# Patient Record
Sex: Female | Born: 1965 | Race: White | Hispanic: No | Marital: Married | Smoking: Never smoker
Health system: Southern US, Community
[De-identification: ages and names within clinical notes are randomized; demographics above are authoritative.]

## PROBLEM LIST (undated history)

## (undated) DIAGNOSIS — E0590 Thyrotoxicosis, unspecified without thyrotoxic crisis or storm: Secondary | ICD-10-CM

## (undated) DIAGNOSIS — K589 Irritable bowel syndrome without diarrhea: Secondary | ICD-10-CM

## (undated) DIAGNOSIS — K754 Autoimmune hepatitis: Secondary | ICD-10-CM

## (undated) DIAGNOSIS — B2790 Infectious mononucleosis, unspecified without complication: Secondary | ICD-10-CM

## (undated) HISTORY — DX: Infectious mononucleosis, unspecified without complication: B27.90

## (undated) HISTORY — DX: Autoimmune hepatitis: K75.4

## (undated) HISTORY — DX: Irritable bowel syndrome without diarrhea: K58.9

## (undated) HISTORY — DX: Thyrotoxicosis, unspecified without thyrotoxic crisis or storm: E05.90

---

## 2013-06-16 HISTORY — PX: COLONOSCOPY: SHX174

## 2017-06-22 ENCOUNTER — Telehealth: Payer: Self-pay | Admitting: Gastroenterology

## 2017-06-22 ENCOUNTER — Encounter: Payer: Self-pay | Admitting: Gastroenterology

## 2017-06-22 NOTE — Telephone Encounter (Signed)
Patient already scheduled to see APP on 06/30/17.

## 2017-06-22 NOTE — Telephone Encounter (Signed)
Routed to DOD. 

## 2017-06-22 NOTE — Telephone Encounter (Signed)
I do not see any ultrasound results or notes in Epic. Please schedule office visit next available appt with any physician or APP. Thanks

## 2017-06-30 ENCOUNTER — Ambulatory Visit: Payer: PRIVATE HEALTH INSURANCE | Admitting: Nurse Practitioner

## 2017-06-30 ENCOUNTER — Other Ambulatory Visit (INDEPENDENT_AMBULATORY_CARE_PROVIDER_SITE_OTHER): Payer: PRIVATE HEALTH INSURANCE

## 2017-06-30 ENCOUNTER — Encounter: Payer: Self-pay | Admitting: Nurse Practitioner

## 2017-06-30 VITALS — BP 110/66 | HR 93 | Ht 66.0 in | Wt 137.8 lb

## 2017-06-30 DIAGNOSIS — R945 Abnormal results of liver function studies: Secondary | ICD-10-CM

## 2017-06-30 DIAGNOSIS — R7989 Other specified abnormal findings of blood chemistry: Secondary | ICD-10-CM

## 2017-06-30 DIAGNOSIS — R634 Abnormal weight loss: Secondary | ICD-10-CM | POA: Diagnosis not present

## 2017-06-30 LAB — HEPATIC FUNCTION PANEL
ALBUMIN: 4.1 g/dL (ref 3.5–5.2)
ALT: 150 U/L — AB (ref 0–35)
AST: 168 U/L — ABNORMAL HIGH (ref 0–37)
Alkaline Phosphatase: 100 U/L (ref 39–117)
BILIRUBIN DIRECT: 0.3 mg/dL (ref 0.0–0.3)
TOTAL PROTEIN: 7.2 g/dL (ref 6.0–8.3)
Total Bilirubin: 1.2 mg/dL (ref 0.2–1.2)

## 2017-06-30 LAB — IGA: IgA: 167 mg/dL (ref 68–378)

## 2017-06-30 LAB — PROTIME-INR
INR: 1.1 ratio — AB (ref 0.8–1.0)
Prothrombin Time: 12.3 s (ref 9.6–13.1)

## 2017-06-30 NOTE — Patient Instructions (Signed)
If you are age 54 or older, your body mass index should be between 23-30. Your Body mass index is 22.24 kg/m. If this is out of the aforementioned range listed, please consider follow up with your Primary Care Provider.  If you are age 54 or younger, your body mass index should be between 19-25. Your Body mass index is 22.24 kg/m. If this is out of the aformentioned range listed, please consider follow up with your Primary Care Provider.   Your physician has requested that you go to the basement for lab work before leaving today.  I will call you with results.  Thank you for choosing me and [facility].   Willette ClusterPaula Guenther, NP

## 2017-06-30 NOTE — Progress Notes (Addendum)
Chief Complaint:  Abnormal liver labs  Referring Provider: Abner GreenspanBeth Hodges, MD      ASSESSMENT AND PLAN;   1. 52 yo female with significant transaminitis since being diagnosed with mono in September.  Also history of heavy alcohol use.  I do not have all the lab results but patient says initially her transaminases were in the 600s, they have apparently lingered around the 300s since then.  Ferritin, TIBC and other labs are pending at PCPs office.  No alcohol use through the month of December.   -Will repeat LFTs today though pattern of transaminitis not typical for alcohol.   -Patient says her viral hepatitis studies were negative, awaiting labs to be faxed -Once all of her labs come in from PCPs office I will see what remaining immune/hereditary markers of liver disease need to be checked.  If all labs returned normal I suspect she will need a liver biopsy -check INR -In the interim I have advised patient to totally avoid alcohol.  Do not take any vitamins, herbs, NSAID  2. Colon cancer screening. Normal colonoscopy two years ago by Dr. Charm BargesButler per patient. She plans to transfer care to us.  -get colon report for our records.   3.  Weight loss of 25 pounds since September which patient believes is all explainable by dietary changes    Agree with Ms. Dorice LamasGuenther's assessment and plan. Lab Results  Component Value Date   ALT 150 (H) 06/30/2017   AST 168 (H) 06/30/2017   ALKPHOS 100 06/30/2017   BILITOT 1.2 06/30/2017    LFT's better it seems. Await outside records EtOH could be part of this  Iva Booparl E. Gessner, MD, [facility]   ADDENDUM:  08/07/17 labs from 06/22/2017.  Iron saturation 39%.  Hepatitis A antibody IgM negative, hepatitis B surface antigen negative, hepatitis B core antibody IgM negative, hepatitis C antibody negative mitochondrial antibody negative.  Ferritin 920, hereditary hemochromatosis D\NA, no mutation identified, ammonia 53  Will make patient follow up now that I have  reviewed remaining labs.  HPI:   Patient is a 52 year old female referred by PCP for abnormal liver chemistries.  In September she had mono. Per patient at that time her AST and ALT were in the 600's. They improved in October and even more so in November per patient. Then in November she drank ETOH and transaminases went up into 300's again .  Remaining liver chemistries normal.  No ETOH in December.  She had repeat LFTs last week, I do not have those results however.  She takes an occas ibuprofen. No herbs, just mild thistle. No exposure to paints, fumes, etc.. No FMH of liver disease. No fevers. She gets a little more tired in evenings then she used to. She has lost 25 pounds since Sept but trying to eat healthy and eliminating fatty foods, sweets, etc. She has RUQ tenderness since mono. BMs are okay overall, just occas mild constipation.   She has a hx of heavy ETOH for ~ 10 year consuming 3-4 glasses of wine most nights. Per patient, Hep A,B,C negative. No illicit drug use. She had thyroid studies in Sept and apparently normal.  Patient had a normal colonoscopy two years ago at outside facility.   Evaluation: Right upper quadrant ultrasound 06/18/2017: diffusely heterogeneous echotexture of the liver with nodular contour of the left lobe of the liver these findings are suggestive of intrinsic liver disease and possible hepatic fibrosis  Past Medical History:  Diagnosis Date  .  Hyperthyroidism   . IBS (irritable bowel syndrome)      History reviewed. No pertinent surgical history. Family History  Problem Relation Age of Onset  . High blood pressure Father    Social History   Tobacco Use  . Smoking status: Never Smoker  . Smokeless tobacco: Never Used  Substance Use Topics  . Alcohol use: Yes    Comment: daily wine drinker (stopped in 02-2017)  . Drug use: No   Current Outpatient Medications  Medication Sig Dispense Refill  . levothyroxine (SYNTHROID, LEVOTHROID) 100 MCG tablet  Take 100 mcg by mouth daily before breakfast.     No current facility-administered medications for this visit.    Not on File   Review of Systems: All systems reviewed and negative except where noted in HPI.    Physical Exam:    BP 110/66   Pulse 93   Ht 5\' 6"  (1.676 m)   Wt 137 lb 12.8 oz (62.5 kg)   BMI 22.24 kg/m  Constitutional:  Thin, well nourished female in no acute distress. Psychiatric: Normal mood and affect. Behavior is normal. EENT: Pupils normal.  Conjunctivae are normal. No scleral icterus. Neck supple.  Cardiovascular: Normal rate, regular rhythm. No edema Pulmonary/chest: Effort normal and breath sounds normal. No wheezing, rales or rhonchi. Abdominal: Soft, nondistended. Nontender. Bowel sounds active throughout. There are no masses palpable. No hepatomegaly. Lymphadenopathy: No cervical adenopathy noted. Neurological: Alert and oriented to person place and time. Skin: Skin is warm and dry. No rashes noted.  Willette Cluster, NP  06/30/2017, 8:56 AM  Cc: Abner Greenspan, MD

## 2017-07-01 LAB — TISSUE TRANSGLUTAMINASE, IGA: (TTG) AB, IGA: 1 U/mL

## 2017-07-08 ENCOUNTER — Telehealth: Payer: Self-pay | Admitting: Nurse Practitioner

## 2017-07-08 NOTE — Telephone Encounter (Signed)
Patient can see her lab results through the patient portal. She says they are improving from her previous levels. She will wait to hear from her provider.

## 2017-07-28 ENCOUNTER — Ambulatory Visit: Payer: Self-pay | Admitting: Gastroenterology

## 2017-08-10 ENCOUNTER — Telehealth: Payer: Self-pay

## 2017-08-10 NOTE — Telephone Encounter (Signed)
Patient is scheduled for follow up. Reminded to abstain from alcohol.

## 2017-08-10 NOTE — Telephone Encounter (Signed)
Left a message on her cell phone requesting she call back to schedule her follow up appointment.

## 2017-08-10 NOTE — Telephone Encounter (Signed)
-----   Message from Meredith PelPaula M Guenther, NP sent at 08/07/2017 12:11 PM EST ----- Waynetta SandyBeth, please let patient know that I received all of her records from Dr. Yetta FlockHodges and I have reviewed them.  She should make a follow-up appointment with me to go over everything now that I have seen the records.  Please make sure she is not drinking alcohol.  Thanks

## 2017-08-24 ENCOUNTER — Ambulatory Visit: Payer: PRIVATE HEALTH INSURANCE | Admitting: Nurse Practitioner

## 2017-08-24 ENCOUNTER — Other Ambulatory Visit (INDEPENDENT_AMBULATORY_CARE_PROVIDER_SITE_OTHER): Payer: PRIVATE HEALTH INSURANCE

## 2017-08-24 ENCOUNTER — Encounter: Payer: Self-pay | Admitting: Nurse Practitioner

## 2017-08-24 VITALS — BP 110/60 | HR 82 | Ht 66.0 in | Wt 137.0 lb

## 2017-08-24 DIAGNOSIS — R109 Unspecified abdominal pain: Secondary | ICD-10-CM

## 2017-08-24 DIAGNOSIS — K7689 Other specified diseases of liver: Secondary | ICD-10-CM | POA: Diagnosis not present

## 2017-08-24 DIAGNOSIS — R945 Abnormal results of liver function studies: Secondary | ICD-10-CM

## 2017-08-24 DIAGNOSIS — R1013 Epigastric pain: Secondary | ICD-10-CM | POA: Diagnosis not present

## 2017-08-24 LAB — HEPATIC FUNCTION PANEL
ALBUMIN: 3.9 g/dL (ref 3.5–5.2)
ALK PHOS: 124 U/L — AB (ref 39–117)
ALT: 122 U/L — ABNORMAL HIGH (ref 0–35)
AST: 173 U/L — AB (ref 0–37)
BILIRUBIN DIRECT: 0.2 mg/dL (ref 0.0–0.3)
BILIRUBIN TOTAL: 0.9 mg/dL (ref 0.2–1.2)
Total Protein: 7.1 g/dL (ref 6.0–8.3)

## 2017-08-24 MED ORDER — OMEPRAZOLE 40 MG PO CPDR: 40.0000 mg | DELAYED_RELEASE_CAPSULE | Freq: Every day | ORAL | 2 refills | Status: DC

## 2017-08-24 NOTE — Progress Notes (Addendum)
      IMPRESSION and PLAN:    #1. 52 yo female with abnormal liver chemistries since being diagnosed with mono in September. She also has hx of ETOH use. Transaminases initially in 600's. I saw her in January AST down to 168 / ALT 150. U/S revealed diffusely heterogenous echotexture of liver with nodular left lobe. Suspect viral infection responsible for marked rise in transaminases though patient may have underlying chronic liver disease as well based on U/S findings. -obtain remaining labs today to complete workup for autoimmune / genetic causes of her abnormal liver chemistries.  -repeat LFTs today.  -hold nsaids for now -continue to abstain from ETOH, last drink was in November.  -if LFTs remain abnormal and remaining autoimmune / genetic markers negative she may need liver biopsy  #2.  Colon cancer screening. Get colonoscopy report from Dr Charm BargesButler  3. Postprandial epigastric burning. pain. She had lost a lot of weight but stable now  Burning could be NSAID related.  -hold NSAIDS for now -trial of omeprazole 40 mg 30 minutes before dinner.        Agree with Nicole Pugh's assessment and plan. Nicole Booparl E. Gessner, MD, [facility] GrahamFACG    HPI:    Chief Complaint: follow up LFTs   Patient is a 52 year old female who I saw in mid Jan for abnormal LFTs found after mono infection. Transaminases initially in 600s, they subsequently came down to 150 -160 range. Acute viral hepatitis panel was negative. U/S suggested nodular left liver lobe. Patient had a hx of ETOH overuse. I was awaiting additional labs from PCP which were received. Patient was to abstain from ETOH and follow up for further evaluation. She comes in today for follow up. Hemachromatosis study was negative, AMA was negative. She hasn't had any ETOH since November.   Nicole Pugh had lost significant amount of weight when I saw her in January. Her weight has since stabilized. She complains of mid upper abdominal burning,  worse in the evening. The  burning doesn't radiate.  Sugar seems to make the burning worse. Doesn't know if fried or spicy foods exacerbate the burning because she already avoids them. She hasn't tried anything for the burning just usually sleeps it off. She takes ibuprofen every other day and has done so for years. No GERD sx. No nausea. BMs are normal.   Review of systems:       Past Medical History:  Diagnosis Date  . Hyperthyroidism   . IBS (irritable bowel syndrome)     Patient's surgical history, family medical history, social history, medications and allergies were all reviewed in Epic    Physical Exam:     BP 110/60   Pulse 82   Ht 5\' 6"  (1.676 m)   Wt 137 lb (62.1 kg)   BMI 22.11 kg/m   GENERAL:  Thin white female in NAD PSYCH: :Pleasant, cooperative, normal affect EENT:  conjunctiva pink, mucous membranes moist, neck supple without masses CARDIAC:  RRR, no murmur heard, no peripheral edema PULM: Normal respiratory effort, lungs CTA bilaterally, no wheezing ABDOMEN:  Nondistended, soft, nontender. No obvious masses, no hepatomegaly,  normal bowel sounds SKIN:  turgor, no lesions seen Musculoskeletal:  Normal muscle tone, normal strength NEURO: Alert and oriented x 3, no focal neurologic deficits   Nicole Pugh , NP 08/24/2017, 10:55 AM

## 2017-08-24 NOTE — Patient Instructions (Addendum)
If you are age 54 or older, your body mass index should be between 23-30. Your Body mass index is 22.11 kg/m. If this is out of the aforementioned range listed, please consider follow up with your Primary Care Provider.  If you are age 54 or younger, your body mass index should be between 19-25. Your Body mass index is 22.11 kg/m. If this is out of the aformentioned range listed, please consider follow up with your Primary Care Provider.   Your physician has requested that you go to the basement for lab work before leaving today.  -Avoid ibuprofen / other anti-inflamatories for now  -tylenol as needed for pain. Don't exceed 2 grams in 24 hours  -continue to avoid alcohol.   Will call labs results.  Call with an update in three weeks.  Thank you for choosing me and [facility].   Willette ClusterPaula Guenther, NP

## 2017-08-25 ENCOUNTER — Encounter: Payer: Self-pay | Admitting: Nurse Practitioner

## 2017-08-27 LAB — CERULOPLASMIN: CERULOPLASMIN: 32 mg/dL (ref 18–53)

## 2017-08-27 LAB — ANTI-NUCLEAR AB-TITER (ANA TITER): ANA Titer 1: 1:160 {titer} — ABNORMAL HIGH

## 2017-08-27 LAB — ALPHA-1-ANTITRYPSIN: A1 ANTITRYPSIN SER: 185 mg/dL (ref 83–199)

## 2017-08-27 LAB — ANTI-SMOOTH MUSCLE ANTIBODY, IGG

## 2017-08-27 LAB — ANA: Anti Nuclear Antibody(ANA): POSITIVE — AB

## 2017-09-01 ENCOUNTER — Other Ambulatory Visit: Payer: Self-pay

## 2017-09-01 DIAGNOSIS — R932 Abnormal findings on diagnostic imaging of liver and biliary tract: Secondary | ICD-10-CM

## 2017-09-01 DIAGNOSIS — R945 Abnormal results of liver function studies: Secondary | ICD-10-CM

## 2017-09-02 ENCOUNTER — Telehealth: Payer: Self-pay | Admitting: Nurse Practitioner

## 2017-09-02 ENCOUNTER — Other Ambulatory Visit: Payer: Self-pay

## 2017-09-02 DIAGNOSIS — R932 Abnormal findings on diagnostic imaging of liver and biliary tract: Secondary | ICD-10-CM

## 2017-09-02 DIAGNOSIS — R7989 Other specified abnormal findings of blood chemistry: Secondary | ICD-10-CM

## 2017-09-02 DIAGNOSIS — R945 Abnormal results of liver function studies: Secondary | ICD-10-CM

## 2017-09-02 NOTE — Telephone Encounter (Signed)
Radiology scheduling called to state order that was put in yesterday as a needle placement needs to be changed to US liver biopsy. Image code is #[id number].

## 2017-09-02 NOTE — Telephone Encounter (Signed)
Order corrected

## 2017-09-14 HISTORY — PX: PERCUTANEOUS LIVER BIOPSY: SUR136

## 2017-09-16 ENCOUNTER — Other Ambulatory Visit: Payer: Self-pay | Admitting: Radiology

## 2017-09-18 ENCOUNTER — Ambulatory Visit (HOSPITAL_COMMUNITY)
Admission: RE | Admit: 2017-09-18 | Discharge: 2017-09-18 | Disposition: A | Discharge status: Home / Self Care | Payer: PRIVATE HEALTH INSURANCE | Source: Ambulatory Visit | Attending: Nurse Practitioner | Admitting: Nurse Practitioner

## 2017-09-18 DIAGNOSIS — R7989 Other specified abnormal findings of blood chemistry: Secondary | ICD-10-CM | POA: Diagnosis not present

## 2017-09-18 DIAGNOSIS — R932 Abnormal findings on diagnostic imaging of liver and biliary tract: Secondary | ICD-10-CM | POA: Insufficient documentation

## 2017-09-18 DIAGNOSIS — K740 Hepatic fibrosis: Secondary | ICD-10-CM | POA: Diagnosis not present

## 2017-09-18 DIAGNOSIS — R945 Abnormal results of liver function studies: Secondary | ICD-10-CM

## 2017-09-18 LAB — CBC
HCT: 37.8 % (ref 36.0–46.0)
HEMOGLOBIN: 12.3 g/dL (ref 12.0–15.0)
MCH: 32 pg (ref 26.0–34.0)
MCHC: 32.5 g/dL (ref 30.0–36.0)
MCV: 98.4 fL (ref 78.0–100.0)
Platelets: 135 10*3/uL — ABNORMAL LOW (ref 150–400)
RBC: 3.84 MIL/uL — AB (ref 3.87–5.11)
RDW: 13.8 % (ref 11.5–15.5)
WBC: 3.9 10*3/uL — ABNORMAL LOW (ref 4.0–10.5)

## 2017-09-18 LAB — PROTIME-INR
INR: 1.07
Prothrombin Time: 13.8 seconds (ref 11.4–15.2)

## 2017-09-18 MED ORDER — MIDAZOLAM HCL 2 MG/2ML IJ SOLN
INTRAMUSCULAR | Status: AC
  Filled 2017-09-18: qty 2

## 2017-09-18 MED ORDER — FENTANYL CITRATE (PF) 100 MCG/2ML IJ SOLN
INTRAMUSCULAR | Status: AC
  Filled 2017-09-18: qty 2

## 2017-09-18 MED ORDER — SODIUM CHLORIDE 0.9 % IV SOLN: INTRAVENOUS | Status: DC

## 2017-09-18 MED ORDER — HYDROMORPHONE HCL 1 MG/ML IJ SOLN
INTRAMUSCULAR | Status: AC | PRN
  Administered 2017-09-18: 1 mg via INTRAVENOUS

## 2017-09-18 MED ORDER — LIDOCAINE HCL (PF) 1 % IJ SOLN
INTRAMUSCULAR | Status: AC
  Filled 2017-09-18: qty 30

## 2017-09-18 MED ORDER — HYDROMORPHONE HCL 1 MG/ML IJ SOLN
INTRAMUSCULAR | Status: AC
  Filled 2017-09-18: qty 1

## 2017-09-18 MED ORDER — SODIUM CHLORIDE 0.9 % IV SOLN
INTRAVENOUS | Status: AC | PRN
  Administered 2017-09-18: 10 mL/h via INTRAVENOUS

## 2017-09-18 MED ORDER — FENTANYL CITRATE (PF) 100 MCG/2ML IJ SOLN
INTRAMUSCULAR | Status: AC | PRN
  Administered 2017-09-18: 50 ug via INTRAVENOUS

## 2017-09-18 MED ORDER — GELATIN ABSORBABLE 12-7 MM EX MISC
CUTANEOUS | Status: AC
  Filled 2017-09-18: qty 1

## 2017-09-18 MED ORDER — HYDROCODONE-ACETAMINOPHEN 5-325 MG PO TABS: 1.0000 | ORAL_TABLET | ORAL | Status: DC | PRN

## 2017-09-18 MED ORDER — MIDAZOLAM HCL 2 MG/2ML IJ SOLN
INTRAMUSCULAR | Status: AC | PRN
  Administered 2017-09-18 (×2): 1 mg via INTRAVENOUS

## 2017-09-18 NOTE — Sedation Documentation (Signed)
Patient is resting comfortably. 

## 2017-09-18 NOTE — Discharge Instructions (Signed)
**Note -identified via Obfuscation** Liver Biopsy, Care After  Refer to this sheet in the next few weeks. These instructions provide you with information on caring for yourself after your procedure. Your health care provider may also give you more specific instructions. Your treatment has been planned according to current medical practices, but problems sometimes occur. Call your health care provider if you have any problems or questions after your procedure.  What can I expect after the procedure?  After your procedure, it is typical to have the following:  · A small amount of discomfort in the area where the biopsy was done and in the right shoulder or shoulder blade.  · A small amount of bruising around the area where the biopsy was done and on the skin over the liver.  · Sleepiness and fatigue for the rest of the day.    Follow these instructions at home:  · Rest at home for 1-2 days or as directed by your health care provider.  · Have a friend or family member stay with you for at least 24 hours.  · Because of the medicines used during the procedure, you should not do the following things in the first 24 hours:  ? Drive.  ? Use machinery.  ? Be responsible for the care of other people.  ? Sign legal documents.  ? Take a bath or shower.  · There are many different ways to close and cover an incision, including stitches, skin glue, and adhesive strips. Follow your health care provider's instructions on:  ? Incision care.  ? Bandage (dressing) changes and removal.  ? Incision closure removal.  · Do not drink alcohol in the first week.  · Do not lift more than 5 pounds or play contact sports for 2 weeks after this test.  · Take medicines only as directed by your health care provider. Do not take medicine containing aspirin or non-steroidal anti-inflammatory medicines such as ibuprofen for 1 week after this test.  · It is your responsibility to get your test results.  Contact a health care provider if:  · You have increased bleeding from an incision  that results in more than a small spot of blood.  · You have redness, swelling, or increasing pain in any incisions.  · You notice a discharge or a bad smell coming from any of your incisions.  · You have a fever or chills.  Get help right away if:  · You develop swelling, bloating, or pain in your abdomen.  · You become dizzy or faint.  · You develop a rash.  · You are nauseous or vomit.  · You have difficulty breathing, feel short of breath, or feel faint.  · You develop chest pain.  · You have problems with your speech or vision.  · You have trouble balancing or moving your arms or legs.  This information is not intended to replace advice given to you by your health care provider. Make sure you discuss any questions you have with your health care provider.  Document Released: 12/20/2004 Document Revised: 11/08/2015 Document Reviewed: 07/29/2013  Elsevier Interactive Patient Education © 2018 [employer] Inc[employer]

## 2017-09-18 NOTE — H&P (Signed)
Chief Complaint: Patient was seen in consultation today for liver core biopsy at the request of Meredith PelGuenther,Paula M  Referring Physician(s): Meredith PelGuenther,Paula M Dr Sharia Reeve Gessner  Supervising Physician: Malachy MoanMcCullough, Heath  [facility]  History of Present Illness: Nicole Pugh is a [age] y.o. female   Pt diagnosed with Mononucleosis 6 mo ago Liver functions have yet to return to normal  Lafe GarinP Guenther NP note 08/24/17: 52 yo female with abnormal liver chemistries since being diagnosed with mono in September. She also has hx of ETOH use. Transaminases initially in 600's. I saw her in January AST down to 168 / ALT 150. U/S revealed diffusely heterogenous echotexture of liver with nodular left lobe. Suspect viral infection responsible for marked rise in transaminases though patient may have underlying chronic liver disease as well based on U/S findings.  Request for liver core biopsy   Past Medical History:  Diagnosis Date  . Hyperthyroidism   . IBS (irritable bowel syndrome)     No past surgical history on file.  Allergies: Patient has no known allergies.  Medications: Prior to Admission medications   Medication Sig Start Date End Date Taking? Authorizing Provider  levothyroxine (SYNTHROID, LEVOTHROID) 100 MCG tablet Take 100 mcg by mouth daily before breakfast.   Yes [provider]  loratadine (CLARITIN) 10 MG tablet Take 10 mg by mouth daily.   Yes [provider]  omeprazole (PRILOSEC) 40 MG capsule Take 1 capsule (40 mg total) by mouth daily. Take 30 minutes before dinner 08/24/17  Yes Meredith PelGuenther, Paula M, NP     Family History  Problem Relation Age of Onset  . High blood pressure Father     Social History   Socioeconomic History  . Marital status: Married    Spouse name: Not on file  . Number of children: Not on file  . Years of education: Not on file  . Highest education level: Not on file  Occupational History  . Not on file  Social  Needs  . Financial resource strain: Not on file  . Food insecurity:    Worry: Not on file    Inability: Not on file  . Transportation needs:    Medical: Not on file    Non-medical: Not on file  Tobacco Use  . Smoking status: Never Smoker  . Smokeless tobacco: Never Used  Substance and Sexual Activity  . Alcohol use: Yes    Comment: daily wine drinker (stopped in 02-2017)  . Drug use: No  . Sexual activity: Yes  Lifestyle  . Physical activity:    Days per week: Not on file    Minutes per session: Not on file  . Stress: Not on file  Relationships  . Social connections:    Talks on phone: Not on file    Gets together: Not on file    Attends religious service: Not on file    Active member of club or organization: Not on file    Attends meetings of clubs or organizations: Not on file    Relationship status: Not on file  Other Topics Concern  . Not on file  Social History Narrative  . Not on file    Review of Systems: A 12 point ROS discussed and pertinent positives are indicated in the HPI above.  All other systems are negative.  Review of Systems  Constitutional: Negative for activity change and fatigue.  Cardiovascular: Negative for chest pain.  Gastrointestinal: Negative for abdominal pain.  Psychiatric/Behavioral: Negative  for behavioral problems and confusion.    Vital Signs: BP 118/70 (BP Location: Right Arm)   Pulse 70   Temp 97.8 F (36.6 C) (Oral)   Ht 5\' 6"  (1.676 m)   Wt 136 lb (61.7 kg)   SpO2 100%   BMI 21.95 kg/m   Physical Exam  Constitutional: She is oriented to person, place, and time.  Cardiovascular: Normal rate and regular rhythm.  Pulmonary/Chest: Effort normal and breath sounds normal.  Abdominal: Soft. Bowel sounds are normal.  Musculoskeletal: Normal range of motion.  Neurological: She is alert and oriented to person, place, and time.  Skin: Skin is warm and dry.  Psychiatric: She has a normal mood and affect. Her behavior is normal.  Judgment and thought content normal.  Nursing note and vitals reviewed.   Imaging: No results found.  Labs:  CBC: Recent Labs    09/18/17 1105  WBC 3.9*  HGB 12.3  HCT 37.8  PLT 135*    COAGS: Recent Labs    06/30/17 0953 09/18/17 1105  INR 1.1* 1.07    BMP: No results for input(s): NA, K, CL, CO2, GLUCOSE, BUN, CALCIUM, CREATININE, GFRNONAA, GFRAA in the last 8760 hours.  Invalid input(s): CMP  LIVER FUNCTION TESTS: Recent Labs    06/30/17 0953 08/24/17 1141  BILITOT 1.2 0.9  AST 168* 173*  ALT 150* 122*  ALKPHOS 100 124*  PROT 7.2 7.1  ALBUMIN 4.1 3.9    TUMOR MARKERS: No results for input(s): AFPTM, CEA, CA199, CHROMGRNA in the last 8760 hours.  Assessment and Plan:  Elevated liver function tests Scheduled for liver core biopsy Risks and benefits discussed with the patient including, but not limited to bleeding, infection, damage to adjacent structures or low yield requiring additional tests.  All of the patient's questions were answered, patient is agreeable to proceed. Consent signed and in chart.   Thank you for this interesting consult.  I greatly enjoyed meeting [name] and look forward to participating in their care.  A copy of this report was sent to the requesting provider on this date.  Electronically Signed: Robet Leu, PA-C 09/18/2017, 12:11 PM   I spent a total of  30 Minutes   in face to face in clinical consultation, greater than 50% of which was counseling/coordinating care for liver core biopsy

## 2017-09-18 NOTE — Procedures (Signed)
Interventional Radiology Procedure Note  Procedure: Random liver bx  Complications: None  Estimated Blood Loss: None  Recommendations: - DC home after bedrest  Signed,  Sterling BigHeath K. Yohannes Waibel, MD

## 2017-09-29 ENCOUNTER — Telehealth: Payer: Self-pay | Admitting: Nurse Practitioner

## 2017-09-29 ENCOUNTER — Encounter: Payer: Self-pay | Admitting: Internal Medicine

## 2017-09-29 ENCOUNTER — Other Ambulatory Visit (INDEPENDENT_AMBULATORY_CARE_PROVIDER_SITE_OTHER): Payer: PRIVATE HEALTH INSURANCE

## 2017-09-29 ENCOUNTER — Other Ambulatory Visit: Payer: Self-pay

## 2017-09-29 DIAGNOSIS — K754 Autoimmune hepatitis: Secondary | ICD-10-CM | POA: Diagnosis not present

## 2017-09-29 LAB — HEPATIC FUNCTION PANEL
ALK PHOS: 154 U/L — AB (ref 39–117)
ALT: 71 U/L — ABNORMAL HIGH (ref 0–35)
AST: 110 U/L — ABNORMAL HIGH (ref 0–37)
Albumin: 3.8 g/dL (ref 3.5–5.2)
BILIRUBIN DIRECT: 0.1 mg/dL (ref 0.0–0.3)
BILIRUBIN TOTAL: 0.5 mg/dL (ref 0.2–1.2)
TOTAL PROTEIN: 6.9 g/dL (ref 6.0–8.3)

## 2017-09-29 MED ORDER — PREDNISONE 10 MG PO TABS: ORAL_TABLET | ORAL | 1 refills | Status: DC

## 2017-09-29 NOTE — Telephone Encounter (Signed)
I called her and explained that she has autoimmune hepatitis.  She needs labs today, tomorrow or Thursday - she knows to come - please order  LFT IgG thiopurine methyltransferase Hepatitis A Antibody total Hepatitis B surface antibody Hepatitis B core antibody total  Start prednisone rx - side effects reviewed  10 mg tabs - take 4 each day # 120 1 refill  Will contact her with results  Need to give her an early May appointment when I am back in the office (add on) so I can meet her and explain more and f/u

## 2017-09-29 NOTE — Telephone Encounter (Signed)
Please review the pathology and advise. She is scheduled to see Dr Leone PayorGessner 11/02/17

## 2017-09-29 NOTE — Progress Notes (Signed)
I called her and am starting Rx

## 2017-09-29 NOTE — Telephone Encounter (Signed)
I spoke with the patient.  Labs are ordered in Epic and she will come this afternoon to have done.  Prednisone was called to her pharmacy and I scheduled her to see you on 10/20/17 at 9:15 am.

## 2017-09-29 NOTE — Telephone Encounter (Signed)
Patient calling to get results from US liver biopsy done on 4.5.19.

## 2017-09-29 NOTE — Telephone Encounter (Signed)
Looks like autoimmune hepatitis. I forwarded results to Leone PayorGessner, he may want to start steroids / immunomodulator since appt with him is several weeks away. Thanks

## 2017-10-04 LAB — HEPATITIS A ANTIBODY, TOTAL: Hepatitis A AB,Total: NONREACTIVE

## 2017-10-04 LAB — IGG: IGG (IMMUNOGLOBIN G), SERUM: 1462 mg/dL (ref 694–1618)

## 2017-10-04 LAB — HEPATITIS B SURFACE ANTIBODY,QUALITATIVE: Hep B S Ab: NONREACTIVE

## 2017-10-04 LAB — HEPATITIS B CORE ANTIBODY, TOTAL: Hep B Core Total Ab: NONREACTIVE

## 2017-10-04 LAB — THIOPURINE METHYLTRANSFERASE (TPMT), RBC: THIOPURINE METHYLTRANSFERASE, RBC: 15 nmol/h/mL

## 2017-10-04 NOTE — Progress Notes (Signed)
Let her know that LFT's about the same  Other important info is that she is NOT immune to Hep A and B so will need vaccinations  She has an OV 5/7 and we could do then or she can come in before  Have her get LFt's 5/3 or 5/6 please  Dx autoimmune hepatitis

## 2017-10-05 ENCOUNTER — Telehealth: Payer: Self-pay | Admitting: Nurse Practitioner

## 2017-10-05 NOTE — Telephone Encounter (Signed)
Pt returned your call and would like a call back .. °

## 2017-10-05 NOTE — Telephone Encounter (Signed)
I gave her the test results from 44/16/19.  She has an upcoming appointment on 10/20/17.

## 2017-10-07 ENCOUNTER — Other Ambulatory Visit: Payer: Self-pay

## 2017-10-07 DIAGNOSIS — K754 Autoimmune hepatitis: Secondary | ICD-10-CM

## 2017-10-16 ENCOUNTER — Other Ambulatory Visit (INDEPENDENT_AMBULATORY_CARE_PROVIDER_SITE_OTHER): Payer: PRIVATE HEALTH INSURANCE

## 2017-10-16 DIAGNOSIS — K754 Autoimmune hepatitis: Secondary | ICD-10-CM

## 2017-10-16 LAB — HEPATIC FUNCTION PANEL
ALBUMIN: 3.7 g/dL (ref 3.5–5.2)
ALT: 50 U/L — ABNORMAL HIGH (ref 0–35)
AST: 38 U/L — AB (ref 0–37)
Alkaline Phosphatase: 108 U/L (ref 39–117)
Bilirubin, Direct: 0.2 mg/dL (ref 0.0–0.3)
TOTAL PROTEIN: 6.7 g/dL (ref 6.0–8.3)
Total Bilirubin: 0.7 mg/dL (ref 0.2–1.2)

## 2017-10-16 NOTE — Progress Notes (Signed)
Labs improved  Will review more at office visit

## 2017-10-20 ENCOUNTER — Ambulatory Visit: Payer: PRIVATE HEALTH INSURANCE | Admitting: Internal Medicine

## 2017-10-20 ENCOUNTER — Encounter: Payer: Self-pay | Admitting: Internal Medicine

## 2017-10-20 VITALS — BP 130/72 | HR 76 | Ht 66.0 in | Wt 135.1 lb

## 2017-10-20 DIAGNOSIS — R1013 Epigastric pain: Secondary | ICD-10-CM | POA: Diagnosis not present

## 2017-10-20 DIAGNOSIS — K754 Autoimmune hepatitis: Secondary | ICD-10-CM | POA: Diagnosis not present

## 2017-10-20 DIAGNOSIS — Z23 Encounter for immunization: Secondary | ICD-10-CM

## 2017-10-20 DIAGNOSIS — Z79899 Other long term (current) drug therapy: Secondary | ICD-10-CM

## 2017-10-20 DIAGNOSIS — Z7960 Long term (current) use of unspecified immunomodulators and immunosuppressants: Secondary | ICD-10-CM

## 2017-10-20 MED ORDER — ZOSTER VAC RECOMB ADJUVANTED 50 MCG/0.5ML IM SUSR: 0.5000 mL | Freq: Once | INTRAMUSCULAR | 1 refills | Status: AC

## 2017-10-20 MED ORDER — HYOSCYAMINE SULFATE 0.125 MG SL SUBL: 0.1250 mg | SUBLINGUAL_TABLET | SUBLINGUAL | 0 refills | Status: DC | PRN

## 2017-10-20 NOTE — Progress Notes (Signed)
YARELIN REICHARDT 52 y.o. 1966-04-13 [id number]  Assessment & Plan:   Encounter Diagnoses  Name Primary?  . Autoimmune hepatitis treated with steroids ([place]) Yes  . Long-term use of immunosuppressant medication   . Abdominal pain, epigastric, postprandial and usually with fatty food    Reduce prednisone to 30 mg daily  Anticipate starting azathioprine at 75 mg daily but clarify her question about whether or not she needs a measles booster since that is a live vaccine would not want to do that now.  I will check with her primary care provider perhaps a titer is all we need to do at this point.  Alcohol does not seem to be a culprit but she should avoid that for now.  The biopsy did not suggest steatohepatitis in any way though she was a regular with 1 glass of wine a day drinker so that may have contributed to the inflammation but I do not think it was the primary problem as best we can tell  Return to see me in July sooner if needed  Lab follow-up to be scheduled pending starting the azathioprine  Initiate hepatitis A and B vaccination today  I have given her a prescription to try to get the Shingrix shingles vaccine when she can as azathioprine may increase the risk of contracting zoster  I did educate her about avoiding live vaccines.  Trial of hyoscyamine for the abdominal pain and discontinue PPI as it did not help  I appreciate the opportunity to care for this patient. CC: Marco Collie, MD  Spoke to Dr. Nyra Capes - no need to test for or booster for measles  Will initiate Azathioprine 75 mg qd  LFT's CBC in 2 weeks Subjective:   Chief Complaint: Autoimmune hepatitis follow-up in clinic initial visit with me  HPI The patient is a 52 year old woman with husband here also today, recently diagnosed with autoimmune hepatitis by liver biopsy, based upon a 1-160+ ANA titer with nucleolar pattern.  She had been diagnosed with mononucleosis last fall and had elevated  transaminases as high as 600 apparently and they never normalized.  She was referred to [place] for further evaluation at which point she had a battery of serologies and the ANA was positive.  She had transaminases above 300 in December 2018, which were in the 8-10 times abnormal range.  Ultrasound demonstrated heterogeneous echogenicity of the liver and also nodular changes in the left lobe in January.  September 18, 2017 liver biopsy findings as below.  No suggestion of steatosis or steatohepatitis.  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 levothyroxine (SYNTHROID, LEVOTHROID) 100 MCG tablet Take 100 mcg by mouth daily before breakfast.  . loratadine (CLARITIN) 10 MG tablet Take 10 mg by mouth daily.  . predniSONE (DELTASONE) 10 MG tablet Take 3 tablets daily   . [DISCONTINUED] omeprazole (PRILOSEC) 40 MG capsule Take 1 capsule (40 mg total) by mouth daily. Take 30 minutes before dinner  . [DISCONTINUED] predniSONE (DELTASONE) 10 MG tablet Take 4 tablets by mouth daily. (Patient taking differently: Take 3  tablets by mouth daily.)   Past Medical History:  Diagnosis Date  . Autoimmune hepatitis ([place])   . Hyperthyroidism   . IBS (irritable bowel syndrome)   . Mononucleosis    Past Surgical History:  Procedure Laterality Date  . COLONOSCOPY  2015   10 yr f/u per patient  . 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   Review of Systems As per HPI  Objective:   Physical Exam '@BP'  130/72   Pulse 76   Ht '5\' 6"'  (1.676 m)   Wt 135 lb 2 oz (61.3 kg)   BMI 21.81 kg/m  @  General:  NAD Eyes:   anicteric Lungs:  clear Heart::  S1S2 no rubs, murmurs or gallops Abdomen:  soft and nontender, BS+ no hepatosplenomegaly or mass Ext:   no edema, cyanosis or clubbing Skin   without stigmata of chronic liver disease   Data Reviewed:  See HPI

## 2017-10-20 NOTE — Patient Instructions (Addendum)
Decrease your prednisone to  daily.  Stop your omeprazole.  Watch your fatty food intake.  We are giving you the rx for Shingrix to take to your pharmacy. Currently it is on national back order.  Dr Leone Payor is going to check on the measles vaccine and get back in touch with you.  We have sent the following medications to your pharmacy for you to pick up at your convenience: hyoscyamine  We are going to try and obtain the colonoscopy done with Dr Charm Barges.  We would like to see you July 10th at at 8:45 AM  Today you have been given your first Twinrix vaccine.  You next injection is 11/19/17 at 9:00AM   I appreciate the opportunity to care for you. Stan Head, MD, [facility]

## 2017-10-23 ENCOUNTER — Telehealth: Payer: Self-pay

## 2017-10-23 DIAGNOSIS — K754 Autoimmune hepatitis: Secondary | ICD-10-CM

## 2017-10-23 MED ORDER — AZATHIOPRINE 75 MG PO TABS: 1.0000 | ORAL_TABLET | Freq: Every day | ORAL | 0 refills | Status: DC

## 2017-10-23 NOTE — Telephone Encounter (Signed)
Patient informed and verbalized understanding. Rx sent in and lab orders entered.

## 2017-10-23 NOTE — Telephone Encounter (Signed)
-----  Message from Gatha Mayer, MD sent at 10/23/2017  7:43 AM EDT ----- Regarding: azathioprine, MMR Please tell patient  1) No need for measles booster or testing - I talked to Dr. Nyra Capes  2) Start azathioprine 75 mg qd # 90 no refill  3) LFT's and CBC in 2 weeks  4) If she feels different - pain, nausea or vomiting after starting azathioprine call back

## 2017-11-02 ENCOUNTER — Ambulatory Visit: Payer: PRIVATE HEALTH INSURANCE | Admitting: Internal Medicine

## 2017-11-11 ENCOUNTER — Other Ambulatory Visit (INDEPENDENT_AMBULATORY_CARE_PROVIDER_SITE_OTHER): Payer: PRIVATE HEALTH INSURANCE

## 2017-11-11 DIAGNOSIS — K754 Autoimmune hepatitis: Secondary | ICD-10-CM | POA: Diagnosis not present

## 2017-11-11 LAB — CBC WITH DIFFERENTIAL/PLATELET
Basophils Absolute: 0 10*3/uL (ref 0.0–0.1)
Basophils Relative: 0.3 % (ref 0.0–3.0)
EOS ABS: 0.1 10*3/uL (ref 0.0–0.7)
Eosinophils Relative: 1.1 % (ref 0.0–5.0)
HEMATOCRIT: 42.2 % (ref 36.0–46.0)
HEMOGLOBIN: 14.2 g/dL (ref 12.0–15.0)
LYMPHS PCT: 18.9 % (ref 12.0–46.0)
Lymphs Abs: 1.6 10*3/uL (ref 0.7–4.0)
MCHC: 33.7 g/dL (ref 30.0–36.0)
MCV: 99.7 fl (ref 78.0–100.0)
MONO ABS: 0.6 10*3/uL (ref 0.1–1.0)
Monocytes Relative: 6.9 % (ref 3.0–12.0)
Neutro Abs: 6.3 10*3/uL (ref 1.4–7.7)
Neutrophils Relative %: 72.8 % (ref 43.0–77.0)
Platelets: 163 10*3/uL (ref 150.0–400.0)
RBC: 4.24 Mil/uL (ref 3.87–5.11)
RDW: 13.9 % (ref 11.5–15.5)
WBC: 8.7 10*3/uL (ref 4.0–10.5)

## 2017-11-11 LAB — HEPATIC FUNCTION PANEL
ALBUMIN: 3.9 g/dL (ref 3.5–5.2)
ALT: 60 U/L — AB (ref 0–35)
AST: 47 U/L — AB (ref 0–37)
Alkaline Phosphatase: 78 U/L (ref 39–117)
BILIRUBIN TOTAL: 0.5 mg/dL (ref 0.2–1.2)
Bilirubin, Direct: 0.2 mg/dL (ref 0.0–0.3)
TOTAL PROTEIN: 6.9 g/dL (ref 6.0–8.3)

## 2017-11-11 NOTE — Progress Notes (Signed)
Labs are stable overall - AST and ALt slightly elevated but in overall scheme this is ok  Repeat same labs in 2 weeks No change in meds except Ca and vit D

## 2017-11-19 ENCOUNTER — Ambulatory Visit (INDEPENDENT_AMBULATORY_CARE_PROVIDER_SITE_OTHER): Payer: PRIVATE HEALTH INSURANCE | Admitting: Internal Medicine

## 2017-11-19 DIAGNOSIS — Z23 Encounter for immunization: Secondary | ICD-10-CM | POA: Diagnosis not present

## 2017-11-19 DIAGNOSIS — K754 Autoimmune hepatitis: Secondary | ICD-10-CM

## 2017-11-25 ENCOUNTER — Other Ambulatory Visit (INDEPENDENT_AMBULATORY_CARE_PROVIDER_SITE_OTHER): Payer: PRIVATE HEALTH INSURANCE

## 2017-11-25 DIAGNOSIS — K754 Autoimmune hepatitis: Secondary | ICD-10-CM | POA: Diagnosis not present

## 2017-11-25 LAB — CBC WITH DIFFERENTIAL/PLATELET
BASOS ABS: 0 10*3/uL (ref 0.0–0.1)
Basophils Relative: 0.2 % (ref 0.0–3.0)
EOS ABS: 0.1 10*3/uL (ref 0.0–0.7)
Eosinophils Relative: 0.8 % (ref 0.0–5.0)
HEMATOCRIT: 42.1 % (ref 36.0–46.0)
HEMOGLOBIN: 14.3 g/dL (ref 12.0–15.0)
LYMPHS PCT: 20.1 % (ref 12.0–46.0)
Lymphs Abs: 1.9 10*3/uL (ref 0.7–4.0)
MCHC: 34.1 g/dL (ref 30.0–36.0)
MCV: 98.4 fl (ref 78.0–100.0)
Monocytes Absolute: 0.7 10*3/uL (ref 0.1–1.0)
Monocytes Relative: 7.3 % (ref 3.0–12.0)
Neutro Abs: 6.7 10*3/uL (ref 1.4–7.7)
Neutrophils Relative %: 71.6 % (ref 43.0–77.0)
Platelets: 183 10*3/uL (ref 150.0–400.0)
RBC: 4.28 Mil/uL (ref 3.87–5.11)
RDW: 14.5 % (ref 11.5–15.5)
WBC: 9.4 10*3/uL (ref 4.0–10.5)

## 2017-11-25 LAB — HEPATIC FUNCTION PANEL
ALT: 62 U/L — AB (ref 0–35)
AST: 50 U/L — ABNORMAL HIGH (ref 0–37)
Albumin: 3.9 g/dL (ref 3.5–5.2)
Alkaline Phosphatase: 80 U/L (ref 39–117)
Bilirubin, Direct: 0.1 mg/dL (ref 0.0–0.3)
TOTAL PROTEIN: 6.5 g/dL (ref 6.0–8.3)
Total Bilirubin: 0.6 mg/dL (ref 0.2–1.2)

## 2017-11-26 ENCOUNTER — Encounter: Payer: Self-pay | Admitting: Internal Medicine

## 2017-11-29 ENCOUNTER — Other Ambulatory Visit: Payer: Self-pay | Admitting: Internal Medicine

## 2017-11-29 DIAGNOSIS — K754 Autoimmune hepatitis: Secondary | ICD-10-CM

## 2017-11-29 NOTE — Progress Notes (Signed)
Labs stable Continue same treatment Repeat labs again before 7/10 visit My Chart message

## 2017-12-21 ENCOUNTER — Other Ambulatory Visit (INDEPENDENT_AMBULATORY_CARE_PROVIDER_SITE_OTHER): Payer: PRIVATE HEALTH INSURANCE

## 2017-12-21 DIAGNOSIS — K754 Autoimmune hepatitis: Secondary | ICD-10-CM | POA: Diagnosis not present

## 2017-12-21 LAB — HEPATIC FUNCTION PANEL
ALK PHOS: 69 U/L (ref 39–117)
ALT: 47 U/L — AB (ref 0–35)
AST: 44 U/L — AB (ref 0–37)
Albumin: 3.7 g/dL (ref 3.5–5.2)
Bilirubin, Direct: 0.2 mg/dL (ref 0.0–0.3)
TOTAL PROTEIN: 6.3 g/dL (ref 6.0–8.3)
Total Bilirubin: 0.8 mg/dL (ref 0.2–1.2)

## 2017-12-21 LAB — CBC WITH DIFFERENTIAL/PLATELET
BASOS ABS: 0 10*3/uL (ref 0.0–0.1)
BASOS PCT: 0.1 % (ref 0.0–3.0)
Eosinophils Absolute: 0 10*3/uL (ref 0.0–0.7)
Eosinophils Relative: 0 % (ref 0.0–5.0)
HEMATOCRIT: 39.1 % (ref 36.0–46.0)
HEMOGLOBIN: 13.5 g/dL (ref 12.0–15.0)
LYMPHS PCT: 6.9 % — AB (ref 12.0–46.0)
Lymphs Abs: 0.5 10*3/uL — ABNORMAL LOW (ref 0.7–4.0)
MCHC: 34.5 g/dL (ref 30.0–36.0)
MCV: 98.5 fl (ref 78.0–100.0)
MONOS PCT: 3 % (ref 3.0–12.0)
Monocytes Absolute: 0.2 10*3/uL (ref 0.1–1.0)
Neutro Abs: 6.8 10*3/uL (ref 1.4–7.7)
Neutrophils Relative %: 90 % — ABNORMAL HIGH (ref 43.0–77.0)
Platelets: 188 10*3/uL (ref 150.0–400.0)
RBC: 3.96 Mil/uL (ref 3.87–5.11)
RDW: 16 % — AB (ref 11.5–15.5)
WBC: 7.5 10*3/uL (ref 4.0–10.5)

## 2017-12-22 NOTE — Progress Notes (Signed)
Labs a little better Will review at 7/10 visit

## 2017-12-23 ENCOUNTER — Encounter: Payer: Self-pay | Admitting: Internal Medicine

## 2017-12-23 ENCOUNTER — Ambulatory Visit: Payer: PRIVATE HEALTH INSURANCE | Admitting: Internal Medicine

## 2017-12-23 VITALS — BP 110/60 | HR 72 | Ht 66.0 in | Wt 138.0 lb

## 2017-12-23 DIAGNOSIS — Z23 Encounter for immunization: Secondary | ICD-10-CM | POA: Diagnosis not present

## 2017-12-23 DIAGNOSIS — K754 Autoimmune hepatitis: Secondary | ICD-10-CM | POA: Diagnosis not present

## 2017-12-23 DIAGNOSIS — Z7960 Long term (current) use of unspecified immunomodulators and immunosuppressants: Secondary | ICD-10-CM

## 2017-12-23 DIAGNOSIS — Z7952 Long term (current) use of systemic steroids: Secondary | ICD-10-CM

## 2017-12-23 DIAGNOSIS — Z79899 Other long term (current) drug therapy: Secondary | ICD-10-CM | POA: Diagnosis not present

## 2017-12-23 NOTE — Progress Notes (Signed)
   Nicole Pugh 52 y.o. Nov 04, 1965 [id number]  Assessment & Plan:   Long-term use of immunosuppressant medication - azathioprine Prevnar Had shingles 1 Recommend see a dermatologist annually GYN - PAP she has been seeing one annualy Sun protection  Long term (current) use of systemic steroids Ca and vit D  Autoimmune hepatitis ([place]) Improving on prednisone and AZA RTC Sept LFT and CBC 1 mo  I appreciate the opportunity to care for you. PJ:SUNHRV, Beth, MD   Subjective:   Chief Complaint: f/u autoimmune hepatitis  HPI Here for f/u on azathioprine and prednisone and feels ok LFTS have gotten better with Tx Remains off EtOH and was never heavy  Hepatic Function Latest Ref Rng & Units 12/21/2017 11/25/2017 11/11/2017  Total Protein 6.0 - 8.3 g/dL 6.3 6.5 6.9  Albumin 3.5 - 5.2 g/dL 3.7 3.9 3.9  AST 0 - 37 U/L 44(H) 50(H) 47(H)  ALT 0 - 35 U/L 47(H) 62(H) 60(H)  Alk Phosphatase 39 - 117 U/L 69 80 78  Total Bilirubin 0.2 - 1.2 mg/dL 0.8 0.6 0.5  Bilirubin, Direct 0.0 - 0.3 mg/dL 0.2 0.1 0.2    Wt Readings from Last 3 Encounters:  12/23/17 138 lb (62.6 kg)  10/20/17 135 lb 2 oz (61.3 kg)  09/18/17 136 lb (61.7 kg)    No Known Allergies Current Meds  Medication Sig  . Azathioprine 75 MG TABS Take 1 tablet (75 mg total) by mouth daily.  . hyoscyamine (LEVSIN SL) 0.125 MG SL tablet Place 1 tablet (0.125 mg total) under the tongue every 4 (four) hours as needed for cramping (abdominal pain).  Marland Kitchen levothyroxine (SYNTHROID, LEVOTHROID) 100 MCG tablet Take 100 mcg by mouth daily before breakfast.  . loratadine (CLARITIN) 10 MG tablet Take 10 mg by mouth daily.  . predniSONE (DELTASONE) 10 MG tablet Take 3 tablets daily    Past Medical History:  Diagnosis Date  . Autoimmune hepatitis ([place])   . Hyperthyroidism   . IBS (irritable bowel syndrome)   . Mononucleosis    Past Surgical History:  Procedure Laterality Date  . COLONOSCOPY  2015   10 yr f/u per patient  .  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   Review of Systems As per HPI  Objective:   Physical Exam '@BP'$  110/60   Pulse 72   Ht '5\' 6"'$  (1.676 m)   Wt 138 lb (62.6 kg)   BMI 22.27 kg/m @  General:  NAD Eyes:   anicteric Lungs:  clear Heart::  S1S2 no rubs, murmurs or gallops Abdomen:  soft and nontender, BS+ Ext:   no edema, cyanosis or clubbing    Data Reviewed:   See HPI

## 2017-12-23 NOTE — Patient Instructions (Addendum)
Today you have been given a Prevnar 13 and the information sheet that goes along with it.   Continue your sun protection through out the year.  Continue your Vitamin D and calcium daily.   Make sure and keep current with your Gynecology and Dermatology visits.    Please come in a month and get blood work drawn. No appointment needed. The lab is open from 7:30AM - 5:30PM  Mon-Friday.   Please follow up with Dr Leone PayorGessner in September.   I appreciate the opportunity to care for you. Stan Headarl Gessner, MD, [facility]

## 2017-12-23 NOTE — Assessment & Plan Note (Signed)
Ca and vit D

## 2017-12-23 NOTE — Assessment & Plan Note (Addendum)
Improving on prednisone and AZA RTC Sept LFT and CBC 1 mo

## 2017-12-23 NOTE — Assessment & Plan Note (Addendum)
Prevnar Had shingles 1 Recommend see a dermatologist annually GYN - PAP she has been seeing one annualy Sun protection

## 2018-01-19 ENCOUNTER — Other Ambulatory Visit: Payer: Self-pay | Admitting: Internal Medicine

## 2018-01-22 ENCOUNTER — Other Ambulatory Visit (INDEPENDENT_AMBULATORY_CARE_PROVIDER_SITE_OTHER): Payer: PRIVATE HEALTH INSURANCE

## 2018-01-22 DIAGNOSIS — Z7960 Long term (current) use of unspecified immunomodulators and immunosuppressants: Secondary | ICD-10-CM

## 2018-01-22 DIAGNOSIS — K754 Autoimmune hepatitis: Secondary | ICD-10-CM | POA: Diagnosis not present

## 2018-01-22 DIAGNOSIS — Z7952 Long term (current) use of systemic steroids: Secondary | ICD-10-CM

## 2018-01-22 DIAGNOSIS — Z79899 Other long term (current) drug therapy: Secondary | ICD-10-CM | POA: Diagnosis not present

## 2018-01-22 LAB — CBC WITH DIFFERENTIAL/PLATELET
BASOS ABS: 0 10*3/uL (ref 0.0–0.1)
Basophils Relative: 0.1 % (ref 0.0–3.0)
EOS ABS: 0 10*3/uL (ref 0.0–0.7)
Eosinophils Relative: 0.2 % (ref 0.0–5.0)
HCT: 38.7 % (ref 36.0–46.0)
HEMOGLOBIN: 13.2 g/dL (ref 12.0–15.0)
LYMPHS ABS: 0.5 10*3/uL — AB (ref 0.7–4.0)
Lymphocytes Relative: 6.5 % — ABNORMAL LOW (ref 12.0–46.0)
MCHC: 34.2 g/dL (ref 30.0–36.0)
MCV: 101.9 fl — ABNORMAL HIGH (ref 78.0–100.0)
MONO ABS: 0.2 10*3/uL (ref 0.1–1.0)
Monocytes Relative: 2.2 % — ABNORMAL LOW (ref 3.0–12.0)
NEUTROS PCT: 91 % — AB (ref 43.0–77.0)
Neutro Abs: 7.5 10*3/uL (ref 1.4–7.7)
Platelets: 167 10*3/uL (ref 150.0–400.0)
RBC: 3.8 Mil/uL — AB (ref 3.87–5.11)
RDW: 17.1 % — ABNORMAL HIGH (ref 11.5–15.5)
WBC: 8.2 10*3/uL (ref 4.0–10.5)

## 2018-01-22 LAB — HEPATIC FUNCTION PANEL
ALT: 42 U/L — AB (ref 0–35)
AST: 44 U/L — AB (ref 0–37)
Albumin: 3.7 g/dL (ref 3.5–5.2)
Alkaline Phosphatase: 84 U/L (ref 39–117)
BILIRUBIN DIRECT: 0.2 mg/dL (ref 0.0–0.3)
BILIRUBIN TOTAL: 1.1 mg/dL (ref 0.2–1.2)
Total Protein: 6.5 g/dL (ref 6.0–8.3)

## 2018-01-26 ENCOUNTER — Other Ambulatory Visit: Payer: Self-pay | Admitting: Internal Medicine

## 2018-01-26 DIAGNOSIS — Z79899 Other long term (current) drug therapy: Secondary | ICD-10-CM

## 2018-01-26 DIAGNOSIS — Z7960 Long term (current) use of unspecified immunomodulators and immunosuppressants: Secondary | ICD-10-CM

## 2018-01-26 NOTE — Progress Notes (Signed)
Persistent elevation transaminases Increased MCV   Medication effect from azathioprine ??  Will check thiopurine metabolites  My Chart message

## 2018-01-27 ENCOUNTER — Other Ambulatory Visit: Payer: PRIVATE HEALTH INSURANCE

## 2018-01-27 DIAGNOSIS — Z7960 Long term (current) use of unspecified immunomodulators and immunosuppressants: Secondary | ICD-10-CM

## 2018-01-27 DIAGNOSIS — Z79899 Other long term (current) drug therapy: Secondary | ICD-10-CM

## 2018-02-03 LAB — THIOPURINE METABOLITES
6 MMP(6-Methylmercaptopurine): 752 pmol/8x10(8)RBC (ref <5700)
6 TG(6-Thioguanine): <50 pmol/8x10(8)RBC — ABNORMAL LOW (ref 235–400)

## 2018-02-05 ENCOUNTER — Other Ambulatory Visit: Payer: Self-pay

## 2018-02-05 DIAGNOSIS — Z7960 Long term (current) use of unspecified immunomodulators and immunosuppressants: Secondary | ICD-10-CM

## 2018-02-05 DIAGNOSIS — Z79899 Other long term (current) drug therapy: Secondary | ICD-10-CM

## 2018-02-05 DIAGNOSIS — K754 Autoimmune hepatitis: Secondary | ICD-10-CM

## 2018-02-05 MED ORDER — AZATHIOPRINE 75 MG PO TABS: 150.0000 mg | ORAL_TABLET | Freq: Every day | ORAL | 2 refills | Status: DC

## 2018-02-05 NOTE — Progress Notes (Signed)
This test tells me that the levels of active metabolite from azathioprine are low and the toxic metabolites are not elevated  1) Double check that she is taking azathioprine at 75 mg daily 2) If she is then go to 150 mg daily - new rx # 1 month supply 2 RF and recheck LFT's and a CBC in 1 month - right before 9/24 appt w/ me 3) Stay at 30 mg daily prednisone

## 2018-02-07 ENCOUNTER — Other Ambulatory Visit: Payer: Self-pay | Admitting: Internal Medicine

## 2018-02-08 NOTE — Telephone Encounter (Signed)
Refill x 2 

## 2018-02-08 NOTE — Telephone Encounter (Signed)
How many refills Sir? 

## 2018-03-05 ENCOUNTER — Other Ambulatory Visit (INDEPENDENT_AMBULATORY_CARE_PROVIDER_SITE_OTHER): Payer: PRIVATE HEALTH INSURANCE

## 2018-03-05 DIAGNOSIS — Z79899 Other long term (current) drug therapy: Secondary | ICD-10-CM | POA: Diagnosis not present

## 2018-03-05 DIAGNOSIS — Z7960 Long term (current) use of unspecified immunomodulators and immunosuppressants: Secondary | ICD-10-CM

## 2018-03-05 DIAGNOSIS — K754 Autoimmune hepatitis: Secondary | ICD-10-CM

## 2018-03-05 LAB — CBC WITH DIFFERENTIAL/PLATELET
BASOS ABS: 0.1 10*3/uL (ref 0.0–0.1)
BASOS PCT: 1 % (ref 0.0–3.0)
EOS ABS: 0.2 10*3/uL (ref 0.0–0.7)
Eosinophils Relative: 2.6 % (ref 0.0–5.0)
HEMATOCRIT: 38.5 % (ref 36.0–46.0)
Hemoglobin: 13.1 g/dL (ref 12.0–15.0)
LYMPHS ABS: 1.5 10*3/uL (ref 0.7–4.0)
LYMPHS PCT: 21 % (ref 12.0–46.0)
MCHC: 34.1 g/dL (ref 30.0–36.0)
MCV: 106.5 fl — ABNORMAL HIGH (ref 78.0–100.0)
MONO ABS: 0.4 10*3/uL (ref 0.1–1.0)
Monocytes Relative: 5.6 % (ref 3.0–12.0)
NEUTROS ABS: 5.1 10*3/uL (ref 1.4–7.7)
Neutrophils Relative %: 69.8 % (ref 43.0–77.0)
PLATELETS: 161 10*3/uL (ref 150.0–400.0)
RBC: 3.62 Mil/uL — ABNORMAL LOW (ref 3.87–5.11)
RDW: 15.6 % — AB (ref 11.5–15.5)
WBC: 7.3 10*3/uL (ref 4.0–10.5)

## 2018-03-05 LAB — HEPATIC FUNCTION PANEL
ALK PHOS: 127 U/L — AB (ref 39–117)
ALT: 45 U/L — AB (ref 0–35)
AST: 60 U/L — AB (ref 0–37)
Albumin: 3.6 g/dL (ref 3.5–5.2)
BILIRUBIN DIRECT: 0.2 mg/dL (ref 0.0–0.3)
BILIRUBIN TOTAL: 0.6 mg/dL (ref 0.2–1.2)
TOTAL PROTEIN: 6.1 g/dL (ref 6.0–8.3)

## 2018-03-09 ENCOUNTER — Ambulatory Visit: Payer: PRIVATE HEALTH INSURANCE | Admitting: Internal Medicine

## 2018-03-09 ENCOUNTER — Encounter: Payer: Self-pay | Admitting: Internal Medicine

## 2018-03-09 VITALS — BP 120/76 | HR 70 | Ht 66.0 in | Wt 143.2 lb

## 2018-03-09 DIAGNOSIS — K754 Autoimmune hepatitis: Secondary | ICD-10-CM

## 2018-03-09 DIAGNOSIS — Z79899 Other long term (current) drug therapy: Secondary | ICD-10-CM | POA: Diagnosis not present

## 2018-03-09 DIAGNOSIS — F19982 Other psychoactive substance use, unspecified with psychoactive substance-induced sleep disorder: Secondary | ICD-10-CM

## 2018-03-09 DIAGNOSIS — Z7960 Long term (current) use of unspecified immunomodulators and immunosuppressants: Secondary | ICD-10-CM

## 2018-03-09 NOTE — Assessment & Plan Note (Addendum)
Will get flu shotthrough PCP She will see dermatology soon

## 2018-03-09 NOTE — Assessment & Plan Note (Addendum)
Not responding to Tx as I would have expected Will stay on 30 mg prednisone Reduce Azasan back to 75 mg ? Side effects w/ fatigue and #'s same Refer to [facility] - ? Needs Cellcept or other vs longer time on steroids Don't think there is other etiology but will get their input

## 2018-03-09 NOTE — Patient Instructions (Signed)
We are going to refer you to [facility] [place], they will contact you with an appointment. There phone # is [phone].   Decrease your azathioprine to 75 mg daily.   Try over the counter Remfresh for insomnia.   Get your flu shot at your PCP.    I appreciate the opportunity to care for you. Stan Headarl Gessner, MD, [facility]

## 2018-03-09 NOTE — Progress Notes (Signed)
Nicole Pugh 52 y.o. 1966/02/27 [id number]  Assessment & Plan:   Encounter Diagnoses  Name Primary?  . Autoimmune hepatitis ([place]) Yes  . Long-term use of immunosuppressant medication - azathioprine   . Drug-induced insomnia ([place])     Autoimmune hepatitis ([place]) Not responding to Tx as I would have expected Will stay on 30 mg prednisone Reduce Azasan back to 75 mg ? Side effects w/ fatigue and #'s same Refer to [facility] - ? Needs Cellcept or other vs longer time on steroids Don't think there is other etiology but will get their input   Long-term use of immunosuppressant medication - azathioprine Will get flu shotthrough PCP She will see dermatology soon   Trial of REM fresh if she wants for insomnia.  That  is a sustained release melatonin product.  I appreciate the opportunity to care for her VX:[id number], [name], MD [facility], NP    Subjective:   Chief Complaint: Follow-up of autoimmune hepatitis  HPI The patient is here for follow-up, she has been treated with prednisone and azathioprine for her autoimmune hepatitis.  Biochemical response was not what I expected, I checked her thio purine metabolites and the 6-TG level was quite low and she did not have high levels of toxic metabolites.  I increased her azathioprine from 75 to 150 mg a day and she is felt more fatigued and achy since then.  She is not having myalgias or muscle cramps or pains like a myositis.  Some mild upper abdominal pains.  No nausea or vomiting or fevers.  Hepatic Function Latest Ref Rng & Units 03/05/2018 01/22/2018 12/21/2017  Total Protein 6.0 - 8.3 g/dL 6.1 6.5 6.3  Albumin 3.5 - 5.2 g/dL 3.6 3.7 3.7  AST 0 - 37 U/L 60(H) 44(H) 44(H)  ALT 0 - 35 U/L 45(H) 42(H) 47(H)  Alk Phosphatase 39 - 117 U/L 127(H) 84 69  Total Bilirubin 0.2 - 1.2 mg/dL 0.6 1.1 0.8  Bilirubin, Direct 0.0 - 0.3 mg/dL 0.2 0.2 0.2   Lab Results  Component Value Date   WBC 7.3 03/05/2018   HGB 13.1 03/05/2018     HCT 38.5 03/05/2018   MCV 106.5 (H) 03/05/2018   PLT 161.0 03/05/2018    Thyroid ok the patient says she had testing done at primary care No Known Allergies Current Meds  Medication Sig  . Azathioprine 75 MG TABS Take 2 tablets (150 mg total) by mouth daily.  Marland Kitchen levothyroxine (SYNTHROID, LEVOTHROID) 100 MCG tablet Take 100 mcg by mouth daily before breakfast.  . loratadine (CLARITIN) 10 MG tablet Take 10 mg by mouth daily.  . predniSONE (DELTASONE) 10 MG tablet Take 3 tablets daily    Past Medical History:  Diagnosis Date  . Autoimmune hepatitis (Nicole Pugh)   . Hyperthyroidism   . IBS (irritable bowel syndrome)   . Mononucleosis    Past Surgical History:  Procedure Laterality Date  . COLONOSCOPY  2015   10 yr f/u per patient  . 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   Review of Systems As per HPI Work remains stressful but not anything new Some nights she awakens at 2 AM and cannot  get back to sleep. She has a dermatology follow-up for appointment coming soon to get her skin checked since she has been on azathioprine  Objective:   Physical Exam BP 120/76   Pulse 70   Ht '5\' 6"'  (1.676 m)   Wt 143 lb 4 oz (65 kg)   BMI 23.12 kg/m  The patient is in no acute distress looks well-developed but a bit fatigued The eyes are anicteric Lungs are clear Heart sounds are normal The abdomen is soft and nontender without organomegaly or mass There is no cyanosis clubbing or edema in the extremities

## 2018-03-10 ENCOUNTER — Telehealth: Payer: Self-pay

## 2018-03-10 NOTE — Telephone Encounter (Signed)
I have faxed the notes along with the referral form to [facility], fax # [phone] to get her an appointment with them for her autoimmune hepatitis, K75.4.

## 2018-03-12 NOTE — Telephone Encounter (Signed)
We received fax stating that they received the referral and that they will notify patient and US of the appointment date/time.

## 2018-03-23 NOTE — Telephone Encounter (Signed)
Patient has appointment on October 15th with the liver care clinic.

## 2018-04-07 ENCOUNTER — Other Ambulatory Visit: Payer: Self-pay | Admitting: Internal Medicine

## 2018-04-13 ENCOUNTER — Other Ambulatory Visit: Payer: Self-pay | Admitting: Nurse Practitioner

## 2018-04-13 DIAGNOSIS — K754 Autoimmune hepatitis: Secondary | ICD-10-CM

## 2018-04-13 DIAGNOSIS — R109 Unspecified abdominal pain: Secondary | ICD-10-CM

## 2018-04-13 DIAGNOSIS — R7989 Other specified abnormal findings of blood chemistry: Secondary | ICD-10-CM

## 2018-04-13 DIAGNOSIS — R945 Abnormal results of liver function studies: Secondary | ICD-10-CM

## 2018-04-19 ENCOUNTER — Telehealth: Payer: Self-pay | Admitting: Internal Medicine

## 2018-04-19 NOTE — Telephone Encounter (Signed)
I left a message for the patient that we cannot due her Hep injection that day.  She will need to call the office for an alternate day.

## 2018-04-19 NOTE — Telephone Encounter (Signed)
Pt is asking if she could have hep B injection this Wednesday since she will be in [place]. Pls call her.

## 2018-04-20 NOTE — Telephone Encounter (Signed)
Patient has been scheduled for 04/26/18;

## 2018-04-21 ENCOUNTER — Ambulatory Visit
Admission: RE | Admit: 2018-04-21 | Discharge: 2018-04-21 | Disposition: A | Discharge status: Home / Self Care | Payer: PRIVATE HEALTH INSURANCE | Source: Ambulatory Visit | Attending: Nurse Practitioner | Admitting: Nurse Practitioner

## 2018-04-21 DIAGNOSIS — R109 Unspecified abdominal pain: Secondary | ICD-10-CM

## 2018-04-21 DIAGNOSIS — K754 Autoimmune hepatitis: Secondary | ICD-10-CM

## 2018-04-21 DIAGNOSIS — R945 Abnormal results of liver function studies: Secondary | ICD-10-CM

## 2018-04-21 DIAGNOSIS — R7989 Other specified abnormal findings of blood chemistry: Secondary | ICD-10-CM

## 2018-05-03 ENCOUNTER — Ambulatory Visit (INDEPENDENT_AMBULATORY_CARE_PROVIDER_SITE_OTHER): Payer: PRIVATE HEALTH INSURANCE | Admitting: Internal Medicine

## 2018-05-03 DIAGNOSIS — Z23 Encounter for immunization: Secondary | ICD-10-CM | POA: Diagnosis not present

## 2018-05-03 DIAGNOSIS — K754 Autoimmune hepatitis: Secondary | ICD-10-CM

## 2018-11-18 ENCOUNTER — Other Ambulatory Visit: Payer: Self-pay | Admitting: Nurse Practitioner

## 2018-11-18 DIAGNOSIS — K7469 Other cirrhosis of liver: Secondary | ICD-10-CM

## 2018-12-21 ENCOUNTER — Ambulatory Visit
Admission: RE | Admit: 2018-12-21 | Discharge: 2018-12-21 | Disposition: A | Discharge status: Home / Self Care | Payer: No Typology Code available for payment source | Source: Ambulatory Visit | Attending: Nurse Practitioner | Admitting: Nurse Practitioner

## 2018-12-21 DIAGNOSIS — K7469 Other cirrhosis of liver: Secondary | ICD-10-CM

## 2019-05-11 DIAGNOSIS — E039 Hypothyroidism, unspecified: Secondary | ICD-10-CM | POA: Diagnosis not present

## 2019-05-20 DIAGNOSIS — Z6824 Body mass index (BMI) 24.0-24.9, adult: Secondary | ICD-10-CM | POA: Diagnosis not present

## 2019-05-20 DIAGNOSIS — E785 Hyperlipidemia, unspecified: Secondary | ICD-10-CM | POA: Diagnosis not present

## 2019-05-20 DIAGNOSIS — E039 Hypothyroidism, unspecified: Secondary | ICD-10-CM | POA: Diagnosis not present

## 2019-05-20 DIAGNOSIS — K754 Autoimmune hepatitis: Secondary | ICD-10-CM | POA: Diagnosis not present

## 2019-06-24 DIAGNOSIS — Z1231 Encounter for screening mammogram for malignant neoplasm of breast: Secondary | ICD-10-CM | POA: Diagnosis not present

## 2019-07-04 DIAGNOSIS — E785 Hyperlipidemia, unspecified: Secondary | ICD-10-CM | POA: Diagnosis not present

## 2019-07-04 DIAGNOSIS — Z1211 Encounter for screening for malignant neoplasm of colon: Secondary | ICD-10-CM | POA: Diagnosis not present

## 2019-07-04 DIAGNOSIS — E039 Hypothyroidism, unspecified: Secondary | ICD-10-CM | POA: Diagnosis not present

## 2019-07-04 DIAGNOSIS — Z01419 Encounter for gynecological examination (general) (routine) without abnormal findings: Secondary | ICD-10-CM | POA: Diagnosis not present

## 2019-07-04 DIAGNOSIS — K754 Autoimmune hepatitis: Secondary | ICD-10-CM | POA: Diagnosis not present

## 2019-07-04 DIAGNOSIS — Z0000 Encounter for general adult medical examination without abnormal findings: Secondary | ICD-10-CM | POA: Diagnosis not present

## 2019-10-18 IMAGING — US US BIOPSY CORE LIVER
1 series · 7 of 7 positions shown · non-contrast
Comparison: none

INDICATION: 51-year-old female with persistently elevated LFTs after a
mononucleosis viral infection.

[Series 1: us biopsy core liver · 0.20mm/px · 7 of 7 slices shown]
[im 1/7]
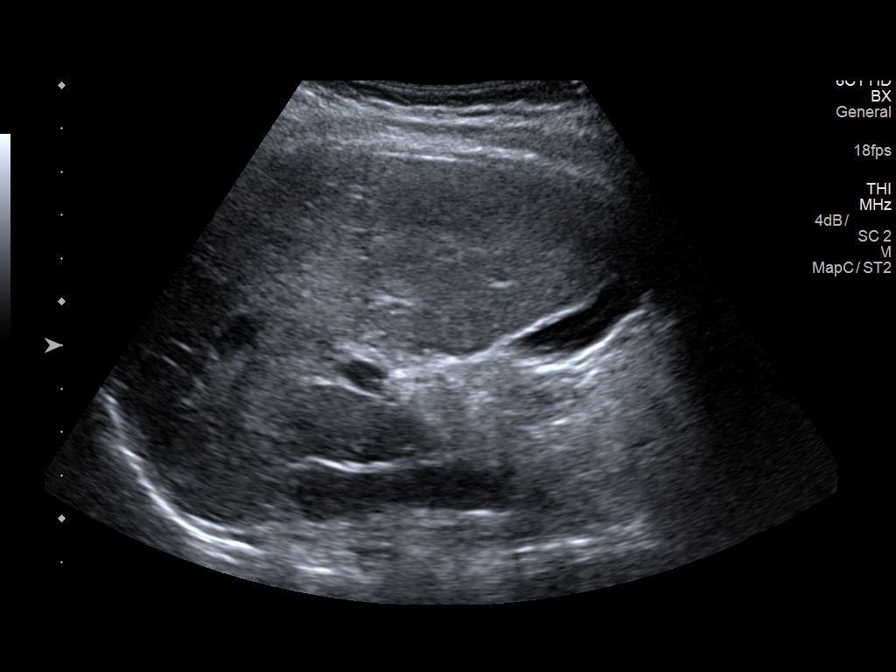
[im 2/7]
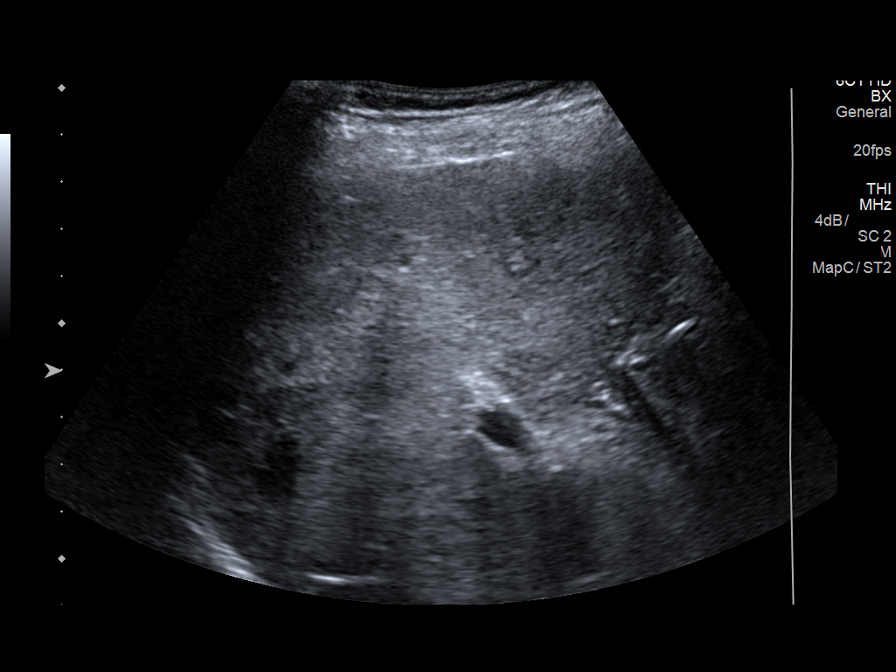
[im 3/7]
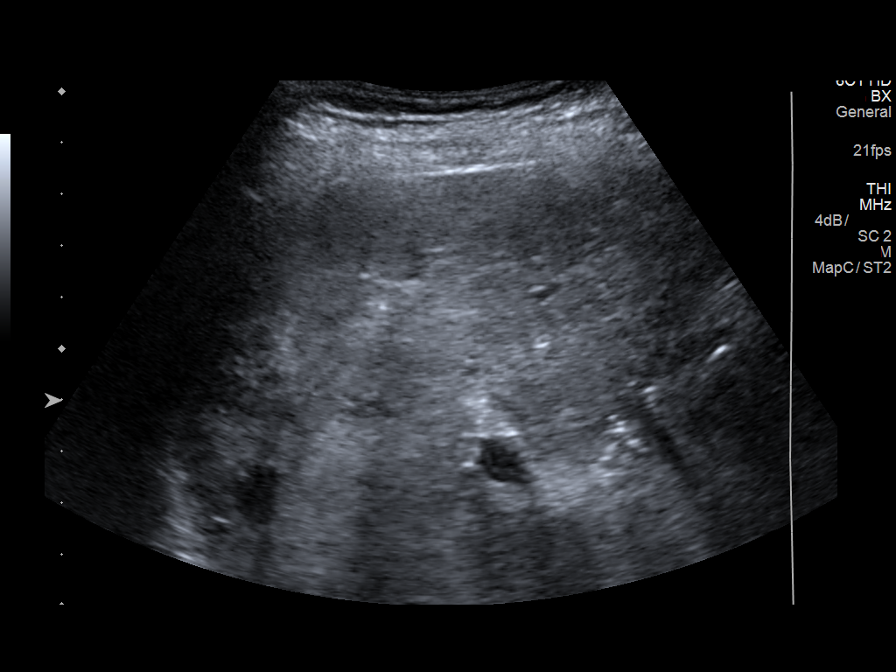
[im 4/7]
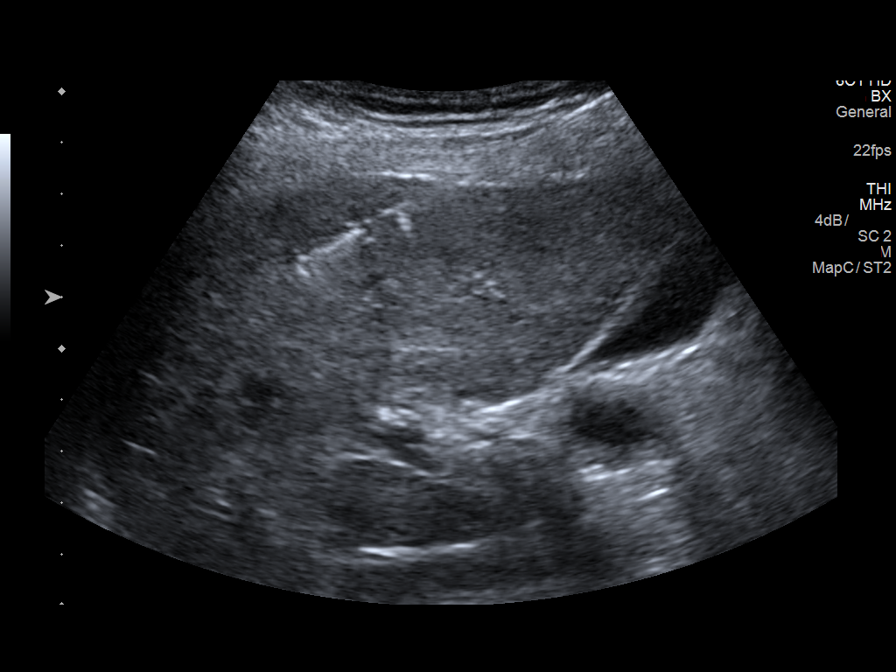
[im 5/7]
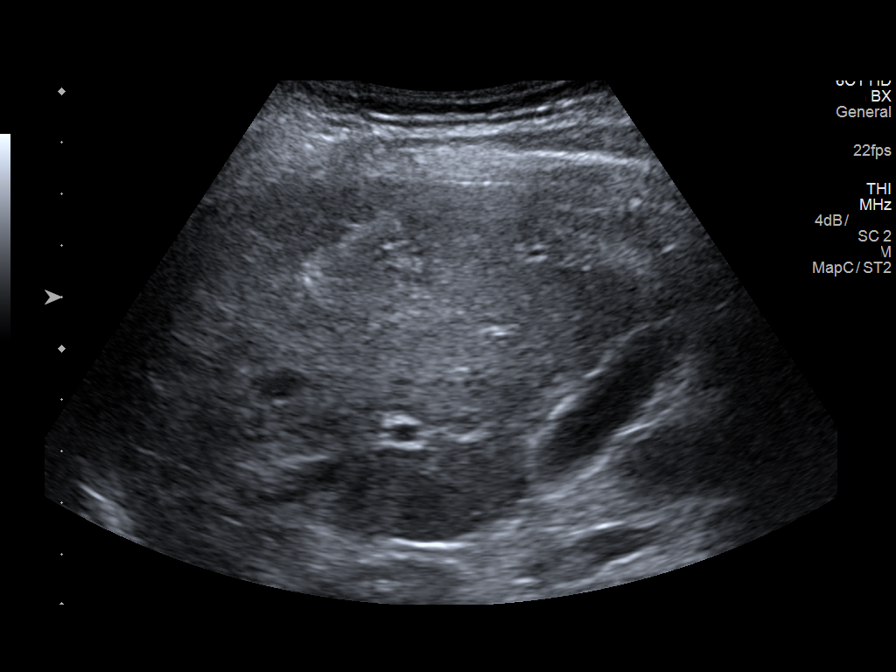
[im 6/7]
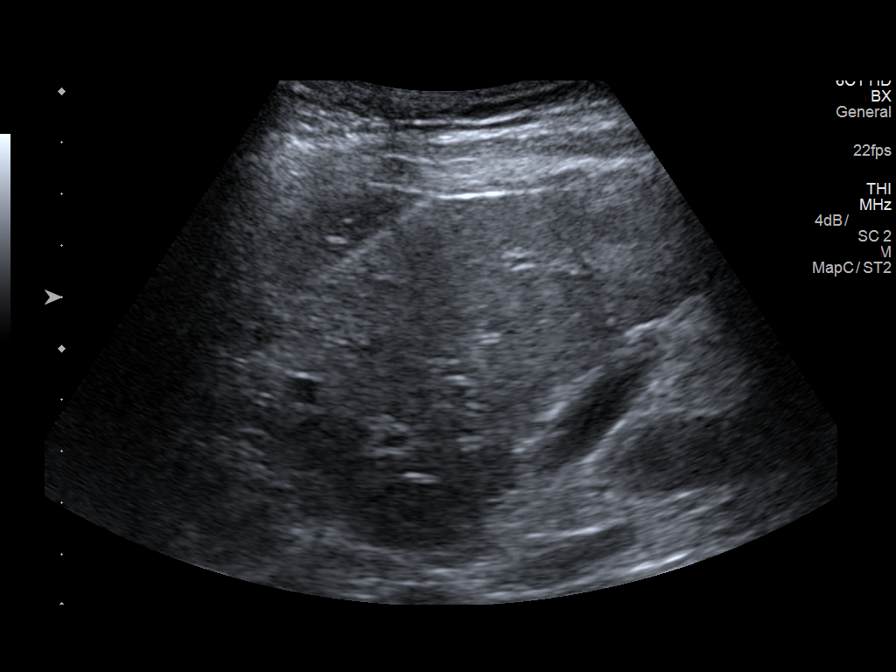
[im 7/7]
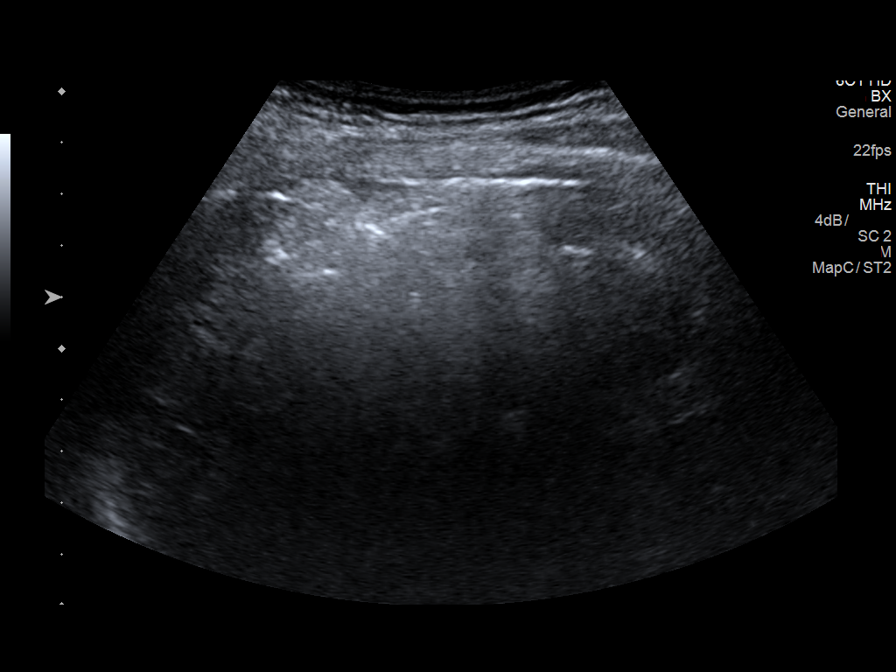

[7 of 7 positions shown; findings below may reference images not displayed]

EXAM:
Ultrasound-guided core biopsy of the liver

MEDICATIONS:
None.

ANESTHESIA/SEDATION:
Fentanyl 2 mcg IV; Versed 50 mg IV

Moderate Sedation Time:  13 minutes

The patient was continuously monitored during the procedure by the
interventional radiology nurse under my direct supervision.

FLUOROSCOPY TIME:  Fluoroscopy Time: 0 minutes 0 seconds (0 mGy).

COMPLICATIONS:
None immediate.

PROCEDURE:
Informed consent was obtained from the patient following explanation
of the procedure, risks, benefits and alternatives. The patient
understands, agrees and consents for the procedure. All questions
were addressed. A time out was performed.

The right upper quadrant was interrogated with ultrasound. A
relatively avascular plane of the liver was identified. A suitable
skin entry site was selected and marked. The region was then
sterilely prepped and draped in standard fashion using chlorhexidine
skin prep. Local anesthesia was attained by infiltration with 1%
lidocaine. A small dermatotomy was made.

Under real-time sonographic guidance, a 17 gauge trocar needle was
advanced into the liver. Multiple 18 gauge core biopsies were then
coaxially obtained. Needle placement was confirmed on all biopsy
passes with real-time sonography. Biopsy specimens were placed in
formalin and delivered to pathology for further analysis.

As the introducer needle was removed, the biopsy tract was embolized
with a Gel-Foam slurry. Post biopsy ultrasound imaging demonstrates
no active bleeding or perihepatic hematoma. The patient tolerated
the procedure well.
IMPRESSION: Technically successful ultrasound-guided random core biopsy of the
liver.

## 2019-10-25 DIAGNOSIS — E785 Hyperlipidemia, unspecified: Secondary | ICD-10-CM | POA: Diagnosis not present

## 2019-10-25 DIAGNOSIS — E039 Hypothyroidism, unspecified: Secondary | ICD-10-CM | POA: Diagnosis not present

## 2019-10-31 DIAGNOSIS — K754 Autoimmune hepatitis: Secondary | ICD-10-CM | POA: Diagnosis not present

## 2019-10-31 DIAGNOSIS — K7400 Hepatic fibrosis, unspecified: Secondary | ICD-10-CM | POA: Diagnosis not present

## 2019-10-31 DIAGNOSIS — E039 Hypothyroidism, unspecified: Secondary | ICD-10-CM | POA: Diagnosis not present

## 2019-10-31 DIAGNOSIS — E785 Hyperlipidemia, unspecified: Secondary | ICD-10-CM | POA: Diagnosis not present

## 2019-11-04 DIAGNOSIS — H04123 Dry eye syndrome of bilateral lacrimal glands: Secondary | ICD-10-CM | POA: Diagnosis not present

## 2020-04-23 DIAGNOSIS — K754 Autoimmune hepatitis: Secondary | ICD-10-CM | POA: Diagnosis not present

## 2020-04-23 DIAGNOSIS — E039 Hypothyroidism, unspecified: Secondary | ICD-10-CM | POA: Diagnosis not present

## 2020-04-23 DIAGNOSIS — E785 Hyperlipidemia, unspecified: Secondary | ICD-10-CM | POA: Diagnosis not present

## 2020-04-30 DIAGNOSIS — E039 Hypothyroidism, unspecified: Secondary | ICD-10-CM | POA: Diagnosis not present

## 2020-04-30 DIAGNOSIS — Z23 Encounter for immunization: Secondary | ICD-10-CM | POA: Diagnosis not present

## 2020-04-30 DIAGNOSIS — K754 Autoimmune hepatitis: Secondary | ICD-10-CM | POA: Diagnosis not present

## 2020-04-30 DIAGNOSIS — E785 Hyperlipidemia, unspecified: Secondary | ICD-10-CM | POA: Diagnosis not present

## 2020-05-17 IMAGING — US ULTRASOUND ABDOMEN LIMITED
1 series · 14 of 25 positions shown · non-contrast
Comparison: Abdominal ultrasound April 21, 2018

CLINICAL DATA: Hepatic cirrhosis

EXAM:
ULTRASOUND ABDOMEN LIMITED RIGHT UPPER QUADRANT

[Series 1: ultrasound abdomen limited · 0.22mm/px · 14 of 39 slices shown]
[im 1/39]
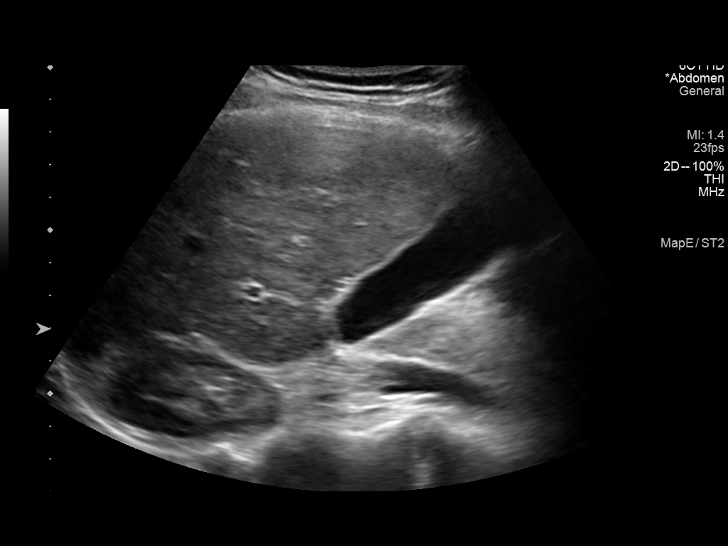
[im 4/39]
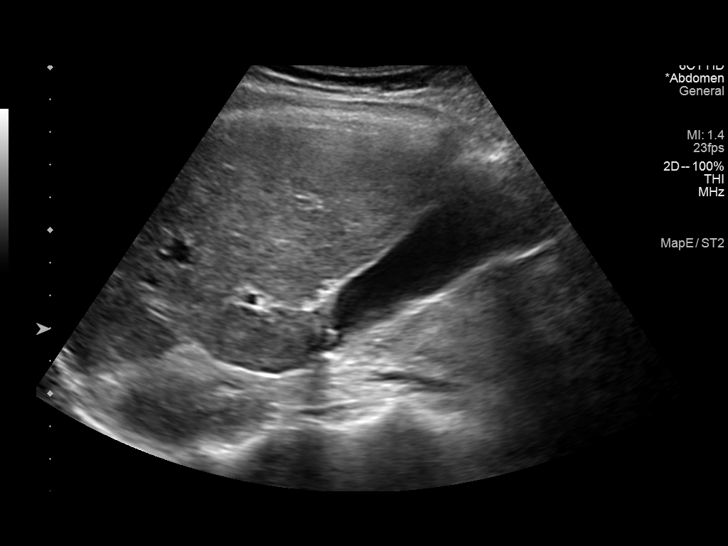
[im 7/39]
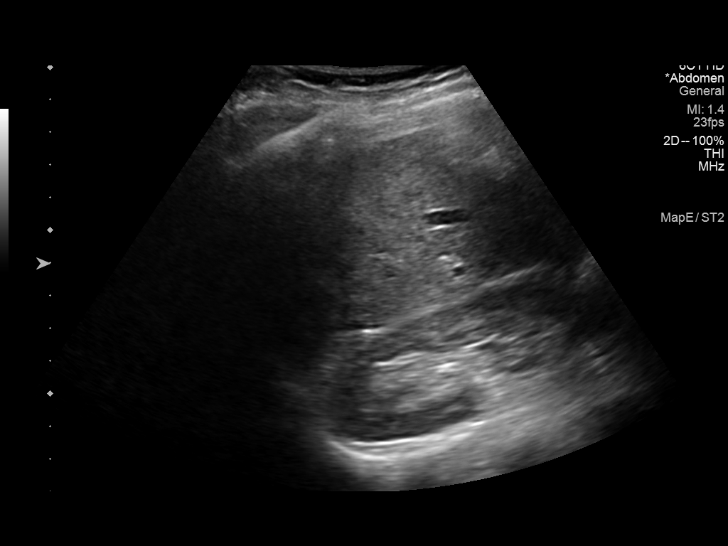
[im 10/39]
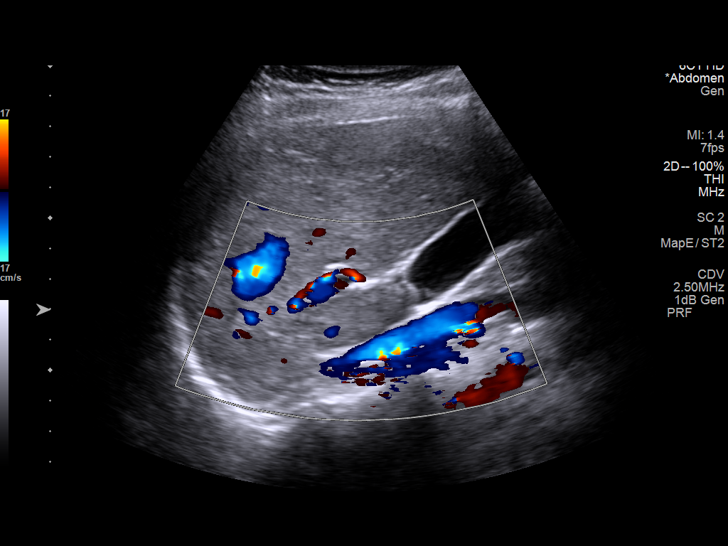
[im 13/39]
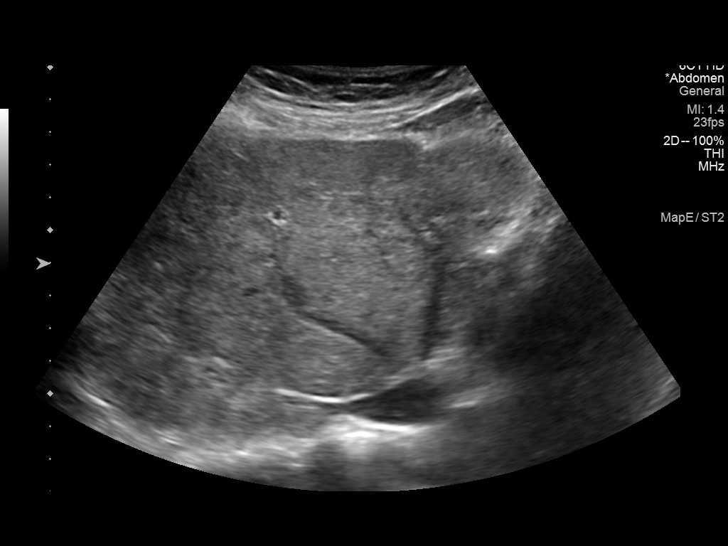
[im 15/39]
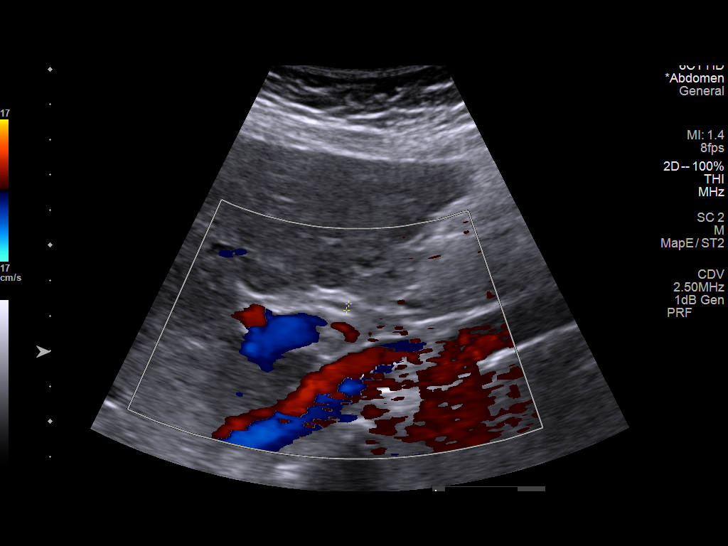
[im 18/39]
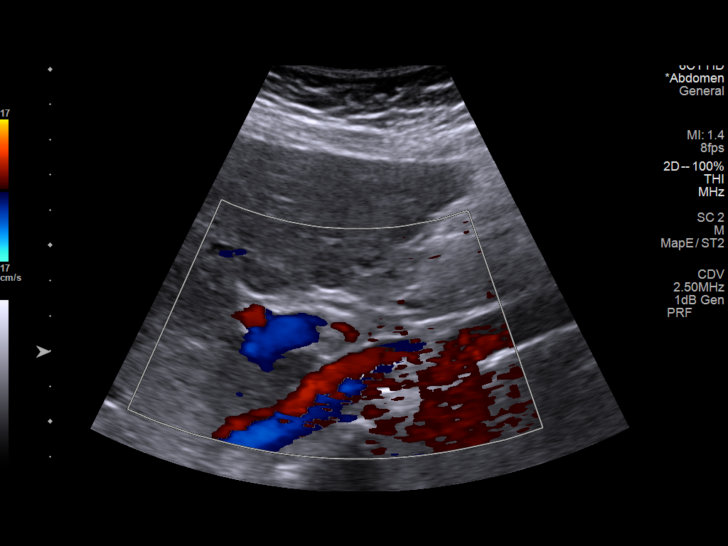
[im 21/39]
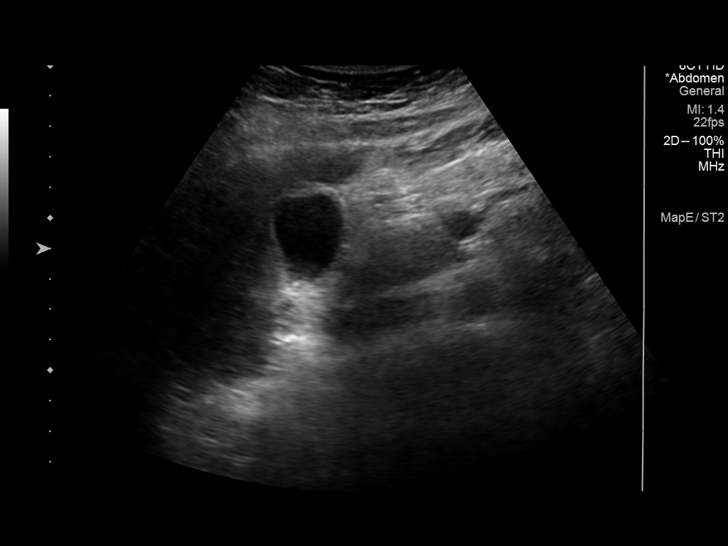
[im 24/39]
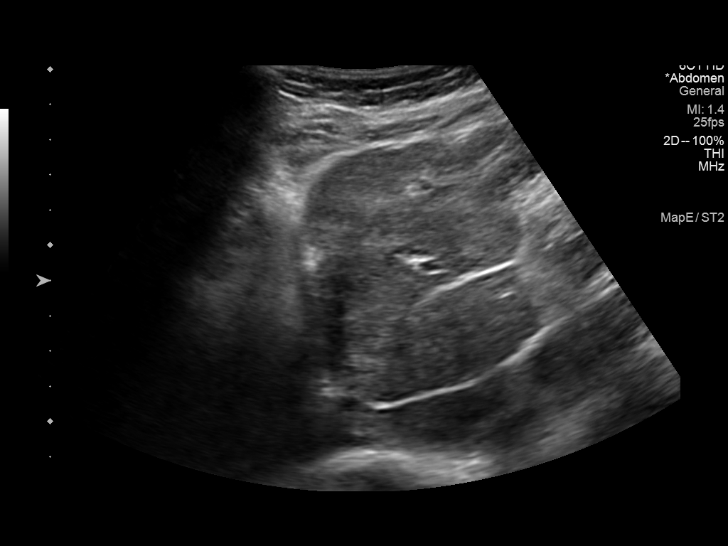
[im 26/39]
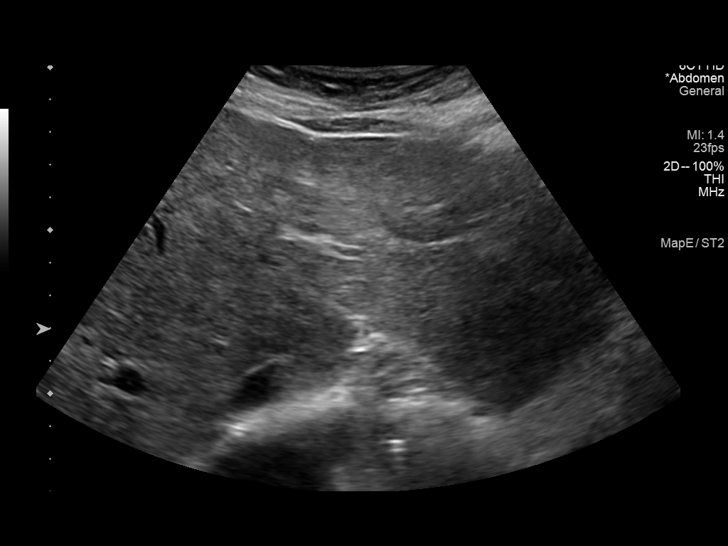
[im 29/39]
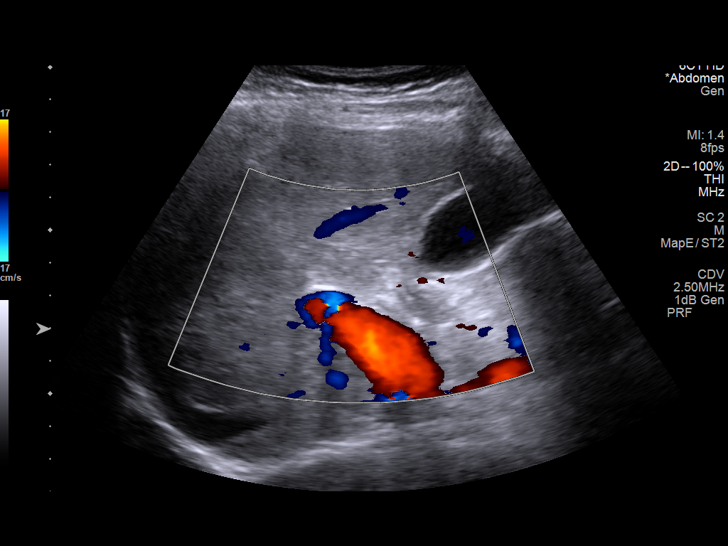
[im 32/39]
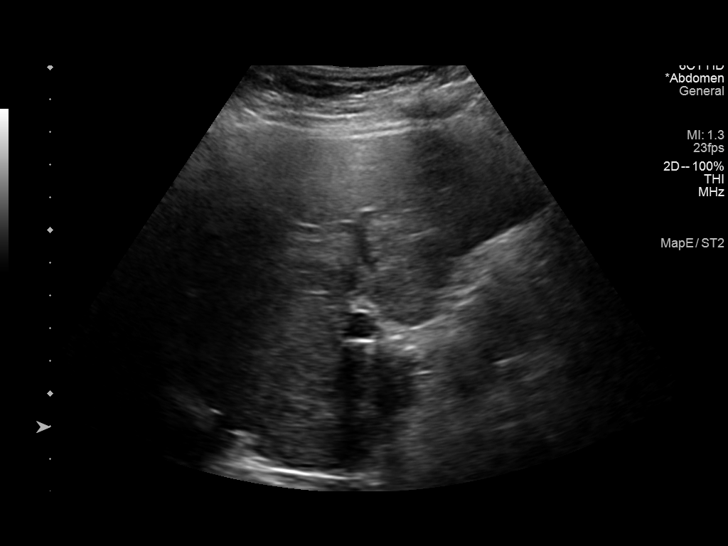
[im 35/39]
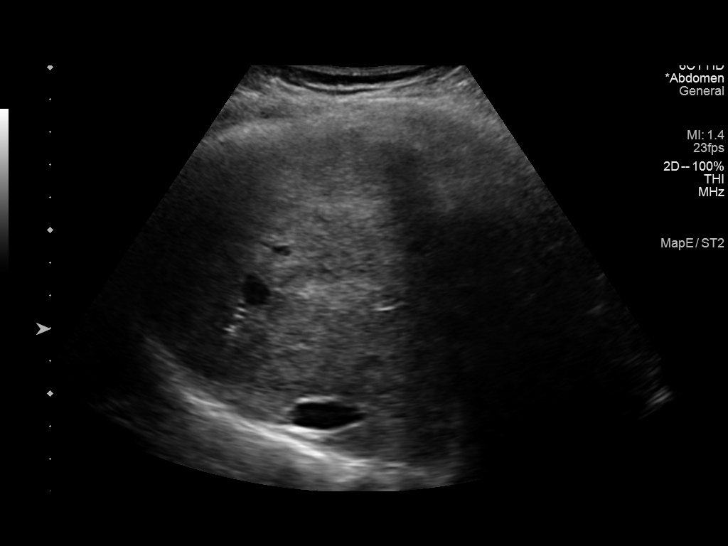
[im 39/39]
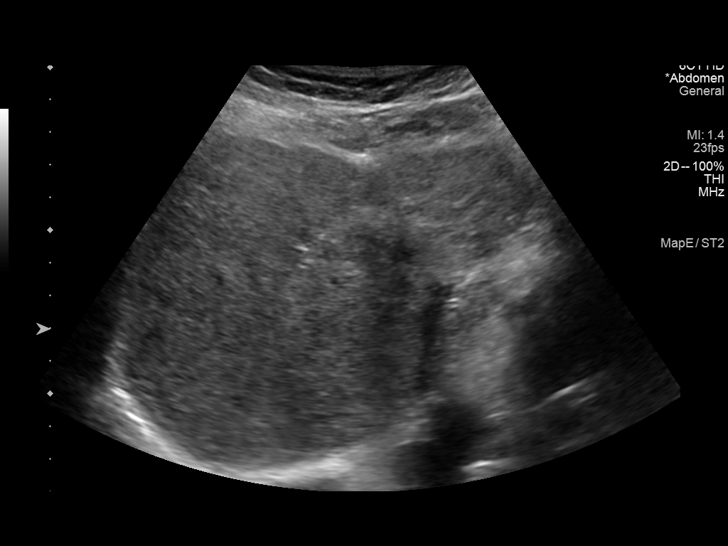

[14 of 25 positions shown; findings below may reference images not displayed]

FINDINGS: Gallbladder:

No gallstones or wall thickening visualized. There is no
pericholecystic fluid. No sonographic Murphy sign noted by
sonographer.

Common bile duct:

Diameter: 3 mm. No intrahepatic or extrahepatic biliary duct
dilatation.

Liver:

No focal lesion identified. Liver contour is somewhat lobular
consistent with cirrhosis. The liver echogenicity is mildly
inhomogeneous and overall increased. Portal vein is patent on color
Doppler imaging with normal direction of blood flow towards the
liver.
IMPRESSION: The appearance of the liver remains indicative of the underlying
cirrhosis. While no focal liver lesions are evident on this study,
it must be cautioned that the sensitivity of ultrasound for
detection of focal liver lesions is somewhat diminished in this
circumstance.

Study otherwise unremarkable.

## 2020-05-20 IMAGING — US US ABDOMEN COMPLETE
1 series · 14 of 25 positions shown · non-contrast
Comparison: Ultrasound 06/19/2017.

CLINICAL DATA: Right upper quadrant abdominal pain. Elevated liver
function studies. Autoimmune hepatitis. Previous liver biopsy.

EXAM:
ABDOMEN ULTRASOUND COMPLETE

[Series 1: us abdomen complete · 0.25mm/px · 14 of 89 slices shown]
[im 1/89]
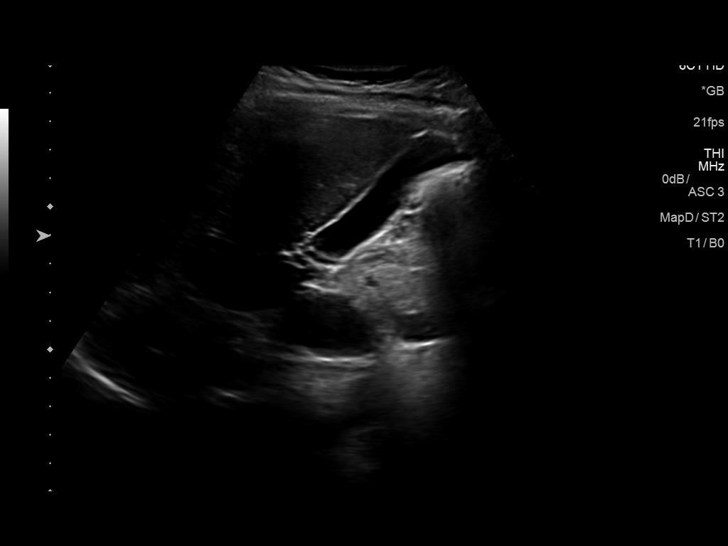
[im 8/89]
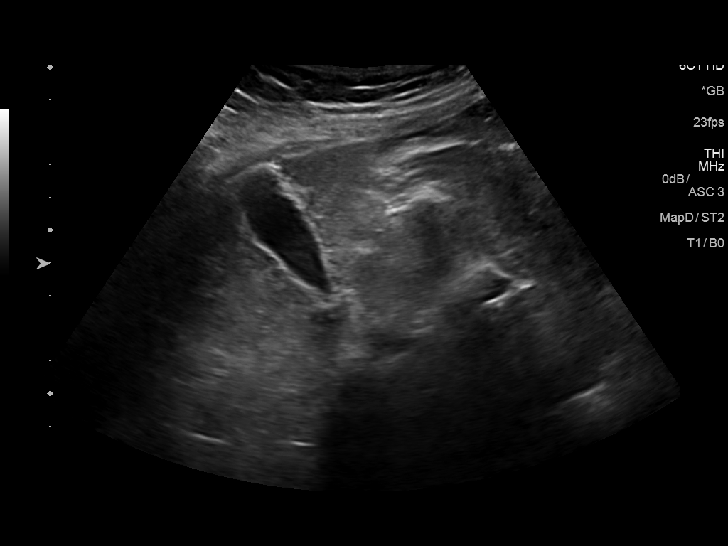
[im 15/89]
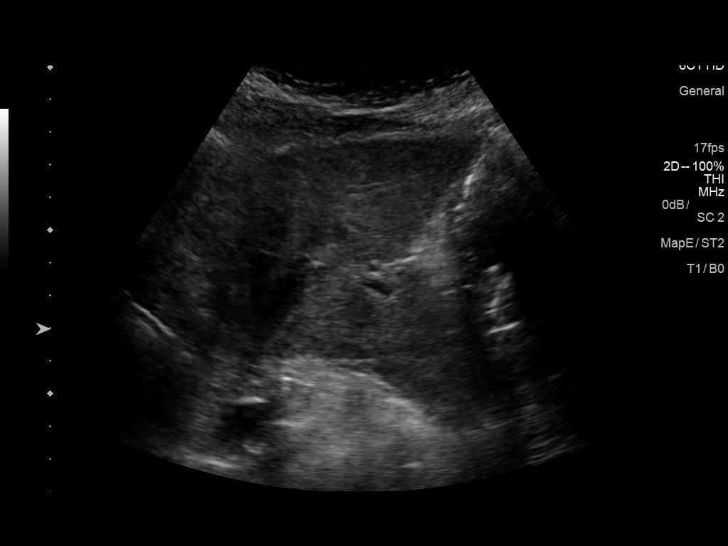
[im 23/89]
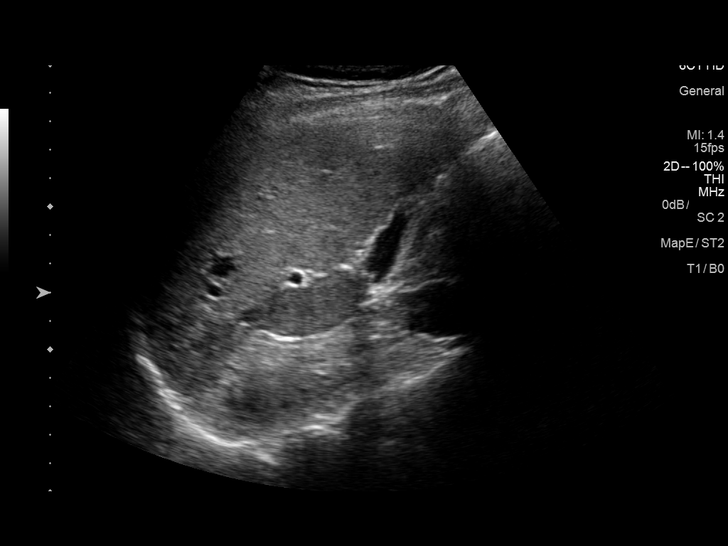
[im 30/89]
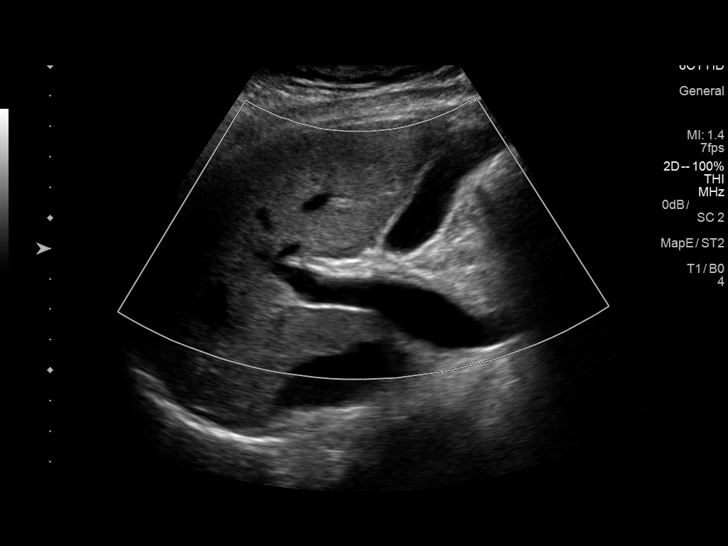
[im 34/89]
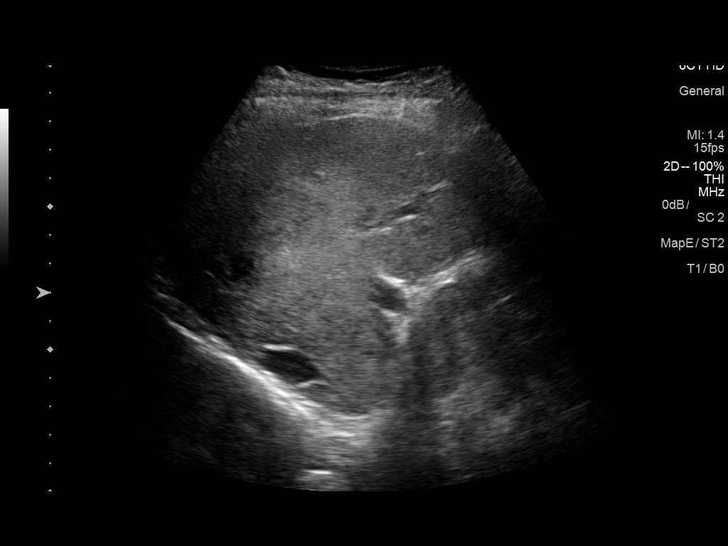
[im 41/89]
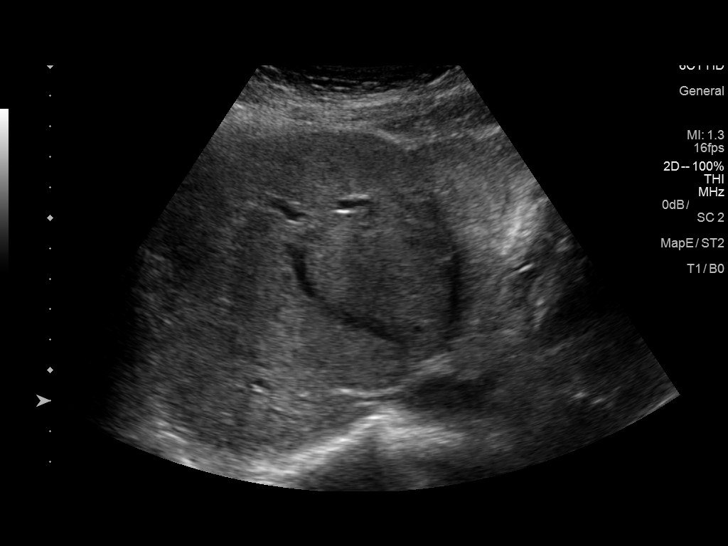
[im 48/89]
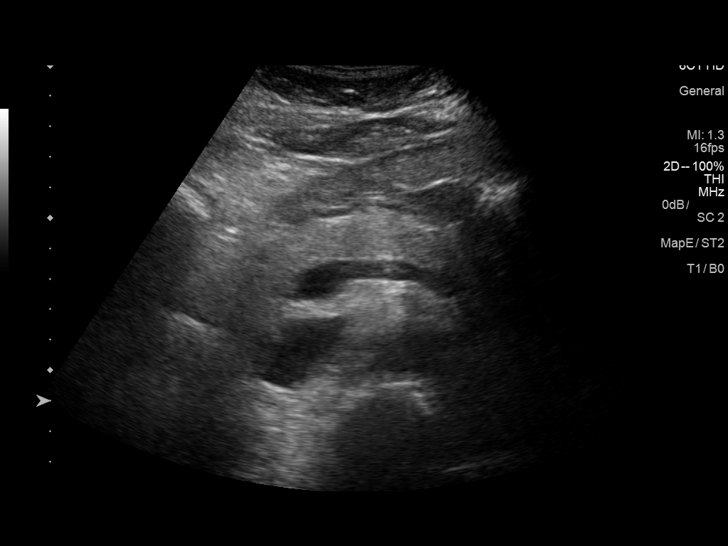
[im 56/89]
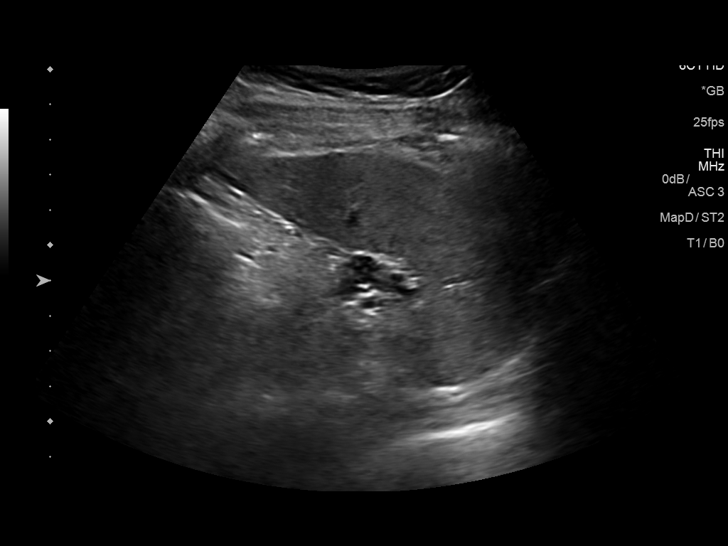
[im 59/89]
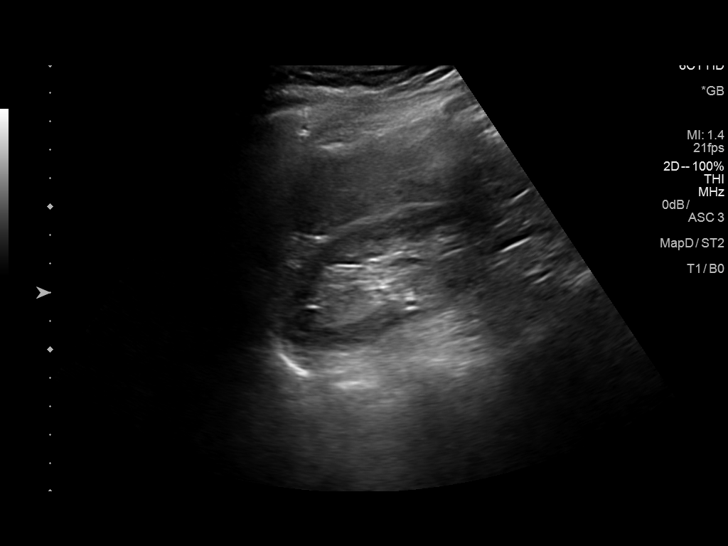
[im 67/89]
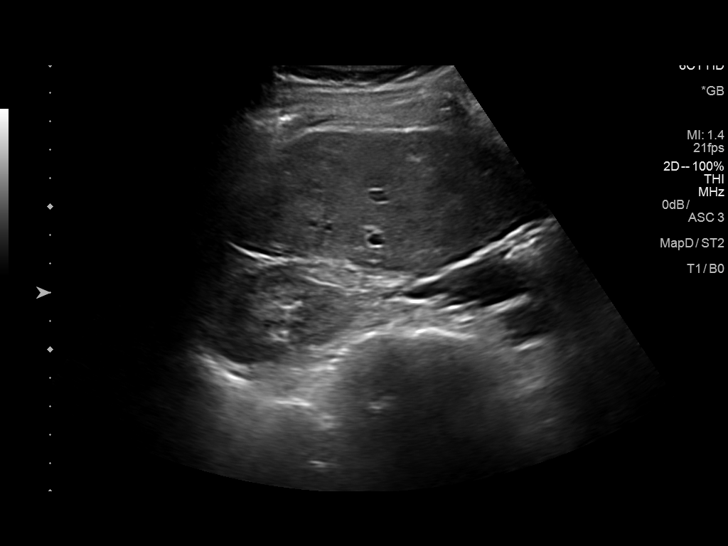
[im 74/89]
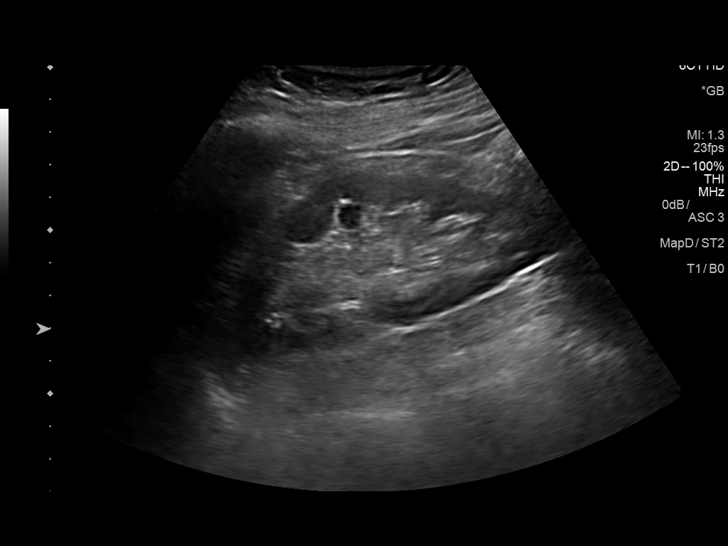
[im 81/89]
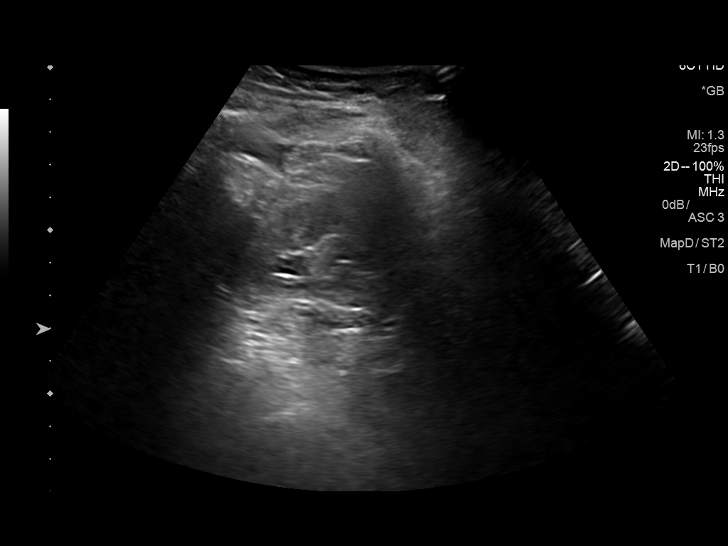
[im 89/89]
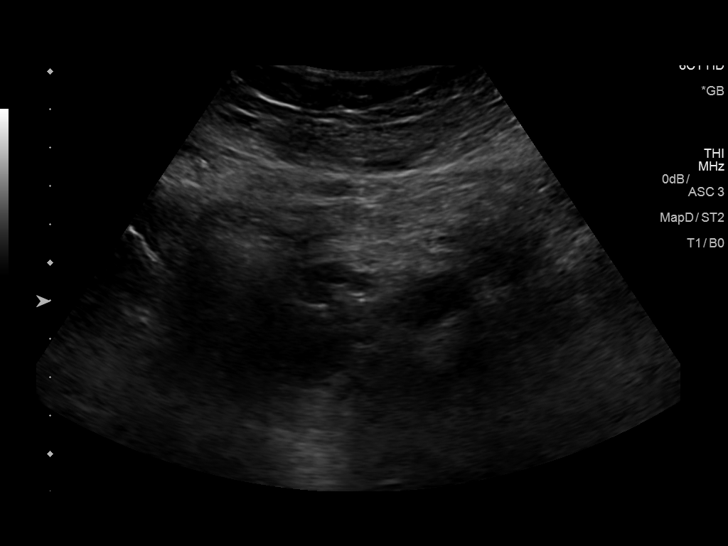

[14 of 25 positions shown; findings below may reference images not displayed]

FINDINGS: Gallbladder: No gallstones or wall thickening visualized. No
sonographic Murphy sign noted by sonographer.

Common bile duct: Diameter: 3 mm

Liver: Mild contour irregularity and heterogeneity of the hepatic
parenchyma without focal abnormality. Portal vein is patent on color
Doppler imaging with normal direction of blood flow towards the
liver.

IVC: No abnormality visualized.

Pancreas: Visualized portion unremarkable.

Spleen: Size and appearance within normal limits.

Right Kidney: Length: 10.7 cm. Echogenicity within normal limits. No
mass or hydronephrosis visualized.

Left Kidney: Length: 11.1 cm. Echogenicity within normal limits. No
mass or hydronephrosis visualized.

Abdominal aorta: No aneurysm visualized.

Other findings: None.
IMPRESSION: 1. No evidence of gallbladder or biliary disease.
2. Mild nonspecific contour irregularity of the liver and
parenchymal heterogeneity.
3. No acute findings identified.

## 2020-08-24 DIAGNOSIS — Z1231 Encounter for screening mammogram for malignant neoplasm of breast: Secondary | ICD-10-CM | POA: Diagnosis not present

## 2020-09-21 DIAGNOSIS — E039 Hypothyroidism, unspecified: Secondary | ICD-10-CM | POA: Diagnosis not present

## 2020-09-21 DIAGNOSIS — E785 Hyperlipidemia, unspecified: Secondary | ICD-10-CM | POA: Diagnosis not present

## 2020-09-21 DIAGNOSIS — K754 Autoimmune hepatitis: Secondary | ICD-10-CM | POA: Diagnosis not present

## 2020-09-28 DIAGNOSIS — E039 Hypothyroidism, unspecified: Secondary | ICD-10-CM | POA: Diagnosis not present

## 2020-09-28 DIAGNOSIS — K754 Autoimmune hepatitis: Secondary | ICD-10-CM | POA: Diagnosis not present

## 2020-09-28 DIAGNOSIS — Z23 Encounter for immunization: Secondary | ICD-10-CM | POA: Diagnosis not present

## 2020-09-28 DIAGNOSIS — E785 Hyperlipidemia, unspecified: Secondary | ICD-10-CM | POA: Diagnosis not present

## 2020-09-28 DIAGNOSIS — I471 Supraventricular tachycardia: Secondary | ICD-10-CM | POA: Diagnosis not present

## 2021-02-25 DIAGNOSIS — E039 Hypothyroidism, unspecified: Secondary | ICD-10-CM | POA: Diagnosis not present

## 2021-02-25 DIAGNOSIS — E785 Hyperlipidemia, unspecified: Secondary | ICD-10-CM | POA: Diagnosis not present

## 2021-02-25 DIAGNOSIS — K754 Autoimmune hepatitis: Secondary | ICD-10-CM | POA: Diagnosis not present

## 2021-03-04 DIAGNOSIS — E039 Hypothyroidism, unspecified: Secondary | ICD-10-CM | POA: Diagnosis not present

## 2021-03-04 DIAGNOSIS — I471 Supraventricular tachycardia: Secondary | ICD-10-CM | POA: Diagnosis not present

## 2021-03-04 DIAGNOSIS — Z23 Encounter for immunization: Secondary | ICD-10-CM | POA: Diagnosis not present

## 2021-03-04 DIAGNOSIS — E785 Hyperlipidemia, unspecified: Secondary | ICD-10-CM | POA: Diagnosis not present

## 2021-03-04 DIAGNOSIS — K754 Autoimmune hepatitis: Secondary | ICD-10-CM | POA: Diagnosis not present

## 2021-03-14 DIAGNOSIS — Z23 Encounter for immunization: Secondary | ICD-10-CM | POA: Diagnosis not present

## 2021-08-12 DIAGNOSIS — K754 Autoimmune hepatitis: Secondary | ICD-10-CM | POA: Diagnosis not present

## 2021-08-12 DIAGNOSIS — I471 Supraventricular tachycardia: Secondary | ICD-10-CM | POA: Diagnosis not present

## 2021-08-12 DIAGNOSIS — E785 Hyperlipidemia, unspecified: Secondary | ICD-10-CM | POA: Diagnosis not present

## 2021-08-12 DIAGNOSIS — E039 Hypothyroidism, unspecified: Secondary | ICD-10-CM | POA: Diagnosis not present

## 2021-08-30 DIAGNOSIS — Z1231 Encounter for screening mammogram for malignant neoplasm of breast: Secondary | ICD-10-CM | POA: Diagnosis not present

## 2022-02-03 DIAGNOSIS — E785 Hyperlipidemia, unspecified: Secondary | ICD-10-CM | POA: Diagnosis not present

## 2022-02-03 DIAGNOSIS — E039 Hypothyroidism, unspecified: Secondary | ICD-10-CM | POA: Diagnosis not present

## 2022-02-10 DIAGNOSIS — E039 Hypothyroidism, unspecified: Secondary | ICD-10-CM | POA: Diagnosis not present

## 2022-02-10 DIAGNOSIS — K754 Autoimmune hepatitis: Secondary | ICD-10-CM | POA: Diagnosis not present

## 2022-02-10 DIAGNOSIS — Z1331 Encounter for screening for depression: Secondary | ICD-10-CM | POA: Diagnosis not present

## 2022-02-10 DIAGNOSIS — Z6825 Body mass index (BMI) 25.0-25.9, adult: Secondary | ICD-10-CM | POA: Diagnosis not present

## 2022-02-10 DIAGNOSIS — I471 Supraventricular tachycardia: Secondary | ICD-10-CM | POA: Diagnosis not present

## 2022-02-10 DIAGNOSIS — E785 Hyperlipidemia, unspecified: Secondary | ICD-10-CM | POA: Diagnosis not present

## 2022-03-26 DIAGNOSIS — Z23 Encounter for immunization: Secondary | ICD-10-CM | POA: Diagnosis not present

## 2022-09-01 DIAGNOSIS — E785 Hyperlipidemia, unspecified: Secondary | ICD-10-CM | POA: Diagnosis not present

## 2022-09-01 DIAGNOSIS — E039 Hypothyroidism, unspecified: Secondary | ICD-10-CM | POA: Diagnosis not present

## 2022-09-08 DIAGNOSIS — E039 Hypothyroidism, unspecified: Secondary | ICD-10-CM | POA: Diagnosis not present

## 2022-09-08 DIAGNOSIS — E785 Hyperlipidemia, unspecified: Secondary | ICD-10-CM | POA: Diagnosis not present

## 2022-09-08 DIAGNOSIS — Z6826 Body mass index (BMI) 26.0-26.9, adult: Secondary | ICD-10-CM | POA: Diagnosis not present

## 2022-09-08 DIAGNOSIS — K754 Autoimmune hepatitis: Secondary | ICD-10-CM | POA: Diagnosis not present

## 2022-09-12 DIAGNOSIS — Z1231 Encounter for screening mammogram for malignant neoplasm of breast: Secondary | ICD-10-CM | POA: Diagnosis not present

## 2022-12-15 DIAGNOSIS — Z0000 Encounter for general adult medical examination without abnormal findings: Secondary | ICD-10-CM | POA: Diagnosis not present

## 2022-12-15 DIAGNOSIS — Z6826 Body mass index (BMI) 26.0-26.9, adult: Secondary | ICD-10-CM | POA: Diagnosis not present

## 2023-03-04 DIAGNOSIS — E785 Hyperlipidemia, unspecified: Secondary | ICD-10-CM | POA: Diagnosis not present

## 2023-03-04 DIAGNOSIS — E039 Hypothyroidism, unspecified: Secondary | ICD-10-CM | POA: Diagnosis not present

## 2023-03-09 DIAGNOSIS — Z1331 Encounter for screening for depression: Secondary | ICD-10-CM | POA: Diagnosis not present

## 2023-03-09 DIAGNOSIS — Z20822 Contact with and (suspected) exposure to covid-19: Secondary | ICD-10-CM | POA: Diagnosis not present

## 2023-03-09 DIAGNOSIS — Z1231 Encounter for screening mammogram for malignant neoplasm of breast: Secondary | ICD-10-CM | POA: Diagnosis not present

## 2023-03-09 DIAGNOSIS — R051 Acute cough: Secondary | ICD-10-CM | POA: Diagnosis not present

## 2023-03-09 DIAGNOSIS — U071 COVID-19: Secondary | ICD-10-CM | POA: Diagnosis not present

## 2023-03-09 DIAGNOSIS — Z6826 Body mass index (BMI) 26.0-26.9, adult: Secondary | ICD-10-CM | POA: Diagnosis not present

## 2023-09-19 DIAGNOSIS — Z1231 Encounter for screening mammogram for malignant neoplasm of breast: Secondary | ICD-10-CM | POA: Diagnosis not present

## 2023-11-04 DIAGNOSIS — E785 Hyperlipidemia, unspecified: Secondary | ICD-10-CM | POA: Diagnosis not present

## 2023-11-04 DIAGNOSIS — E039 Hypothyroidism, unspecified: Secondary | ICD-10-CM | POA: Diagnosis not present

## 2023-11-17 DIAGNOSIS — E039 Hypothyroidism, unspecified: Secondary | ICD-10-CM | POA: Diagnosis not present

## 2023-11-17 DIAGNOSIS — K754 Autoimmune hepatitis: Secondary | ICD-10-CM | POA: Diagnosis not present

## 2023-11-17 DIAGNOSIS — Z6827 Body mass index (BMI) 27.0-27.9, adult: Secondary | ICD-10-CM | POA: Diagnosis not present

## 2023-11-17 DIAGNOSIS — E785 Hyperlipidemia, unspecified: Secondary | ICD-10-CM | POA: Diagnosis not present

## 2024-06-06 DIAGNOSIS — E039 Hypothyroidism, unspecified: Secondary | ICD-10-CM | POA: Diagnosis not present

## 2024-06-06 DIAGNOSIS — E785 Hyperlipidemia, unspecified: Secondary | ICD-10-CM | POA: Diagnosis not present
